# Patient Record
Sex: Male | Born: 1955 | Race: White | Hispanic: No | Smoking: Never smoker
Health system: Southern US, Community
[De-identification: ages and names within clinical notes are randomized; demographics above are authoritative.]

## PROBLEM LIST (undated history)

## (undated) DIAGNOSIS — I2089 Other forms of angina pectoris: Secondary | ICD-10-CM

## (undated) DIAGNOSIS — I208 Other forms of angina pectoris: Secondary | ICD-10-CM

## (undated) DIAGNOSIS — E785 Hyperlipidemia, unspecified: Secondary | ICD-10-CM

## (undated) DIAGNOSIS — I639 Cerebral infarction, unspecified: Secondary | ICD-10-CM

## (undated) DIAGNOSIS — I2510 Atherosclerotic heart disease of native coronary artery without angina pectoris: Secondary | ICD-10-CM

## (undated) DIAGNOSIS — E119 Type 2 diabetes mellitus without complications: Secondary | ICD-10-CM

## (undated) DIAGNOSIS — I519 Heart disease, unspecified: Secondary | ICD-10-CM

## (undated) DIAGNOSIS — I10 Essential (primary) hypertension: Secondary | ICD-10-CM

## (undated) HISTORY — DX: Atherosclerotic heart disease of native coronary artery without angina pectoris: I25.10

## (undated) HISTORY — DX: Hyperlipidemia, unspecified: E78.5

## (undated) HISTORY — DX: Heart disease, unspecified: I51.9

## (undated) HISTORY — DX: Essential (primary) hypertension: I10

## (undated) HISTORY — DX: Other forms of angina pectoris: I20.89

## (undated) HISTORY — DX: Type 2 diabetes mellitus without complications: E11.9

## (undated) HISTORY — DX: Other forms of angina pectoris: I20.8

## (undated) HISTORY — DX: Cerebral infarction, unspecified: I63.9

---

## 1993-06-01 HISTORY — PX: CORONARY ANGIOPLASTY: SHX604

## 1998-07-28 ENCOUNTER — Ambulatory Visit (HOSPITAL_BASED_OUTPATIENT_CLINIC_OR_DEPARTMENT_OTHER): Admission: RE | Admit: 1998-07-28 | Discharge: 1998-07-28 | Payer: Self-pay | Admitting: Urology

## 2007-04-04 HISTORY — PX: CORONARY ANGIOPLASTY WITH STENT PLACEMENT: SHX49

## 2007-05-01 ENCOUNTER — Encounter: Admission: RE | Admit: 2007-05-01 | Discharge: 2007-05-01 | Payer: Self-pay | Admitting: Cardiovascular Disease

## 2007-05-02 ENCOUNTER — Ambulatory Visit (HOSPITAL_COMMUNITY): Admission: RE | Admit: 2007-05-02 | Discharge: 2007-05-03 | Payer: Self-pay | Admitting: Cardiovascular Disease

## 2007-09-14 ENCOUNTER — Inpatient Hospital Stay (HOSPITAL_COMMUNITY): Admission: EM | Admit: 2007-09-14 | Discharge: 2007-09-17 | Payer: Self-pay | Admitting: Emergency Medicine

## 2007-11-05 ENCOUNTER — Inpatient Hospital Stay (HOSPITAL_COMMUNITY): Admission: EM | Admit: 2007-11-05 | Discharge: 2007-11-06 | Payer: Self-pay | Admitting: Emergency Medicine

## 2008-04-03 HISTORY — PX: CORONARY ANGIOPLASTY WITH STENT PLACEMENT: SHX49

## 2008-08-19 ENCOUNTER — Encounter: Admission: RE | Admit: 2008-08-19 | Discharge: 2008-08-19 | Payer: Self-pay | Admitting: Cardiovascular Disease

## 2008-08-21 ENCOUNTER — Inpatient Hospital Stay (HOSPITAL_COMMUNITY): Admission: RE | Admit: 2008-08-21 | Discharge: 2008-08-22 | Payer: Self-pay | Admitting: Cardiovascular Disease

## 2008-11-04 ENCOUNTER — Inpatient Hospital Stay (HOSPITAL_COMMUNITY): Admission: AD | Admit: 2008-11-04 | Discharge: 2008-11-07 | Payer: Self-pay | Admitting: Cardiovascular Disease

## 2010-03-10 ENCOUNTER — Inpatient Hospital Stay (HOSPITAL_COMMUNITY): Admission: EM | Admit: 2010-03-10 | Discharge: 2009-08-25 | Payer: Self-pay | Admitting: Emergency Medicine

## 2010-06-20 LAB — BASIC METABOLIC PANEL
BUN: 12 mg/dL (ref 6–23)
BUN: 15 mg/dL (ref 6–23)
CO2: 31 mEq/L (ref 19–32)
Calcium: 8.3 mg/dL — ABNORMAL LOW (ref 8.4–10.5)
Chloride: 102 mEq/L (ref 96–112)
Chloride: 103 mEq/L (ref 96–112)
Creatinine, Ser: 1.02 mg/dL (ref 0.4–1.5)
GFR calc Af Amer: >60 mL/min (ref >60)
GFR calc non Af Amer: >60 mL/min (ref >60)
Glucose, Bld: 113 mg/dL — ABNORMAL HIGH (ref 70–99)
Glucose, Bld: 190 mg/dL — ABNORMAL HIGH (ref 70–99)
Potassium: 4.1 mEq/L (ref 3.5–5.1)
Sodium: 137 mEq/L (ref 135–145)

## 2010-06-20 LAB — GLUCOSE, CAPILLARY
Glucose-Capillary: 117 mg/dL — ABNORMAL HIGH (ref 70–99)
Glucose-Capillary: 140 mg/dL — ABNORMAL HIGH (ref 70–99)
Glucose-Capillary: 143 mg/dL — ABNORMAL HIGH (ref 70–99)
Glucose-Capillary: 146 mg/dL — ABNORMAL HIGH (ref 70–99)
Glucose-Capillary: 207 mg/dL — ABNORMAL HIGH (ref 70–99)
Glucose-Capillary: 227 mg/dL — ABNORMAL HIGH (ref 70–99)
Glucose-Capillary: 92 mg/dL (ref 70–99)

## 2010-06-20 LAB — URINALYSIS, ROUTINE W REFLEX MICROSCOPIC
Bilirubin Urine: NEGATIVE
Nitrite: NEGATIVE
Protein, ur: NEGATIVE mg/dL
Urobilinogen, UA: 0.2 mg/dL (ref 0.0–1.0)

## 2010-06-20 LAB — COMPREHENSIVE METABOLIC PANEL
ALT: 26 U/L (ref 0–53)
AST: 23 U/L (ref 0–37)
Albumin: 3.3 g/dL — ABNORMAL LOW (ref 3.5–5.2)
Alkaline Phosphatase: 61 U/L (ref 39–117)
Alkaline Phosphatase: 69 U/L (ref 39–117)
BUN: 15 mg/dL (ref 6–23)
CO2: 27 mEq/L (ref 19–32)
Calcium: 8.3 mg/dL — ABNORMAL LOW (ref 8.4–10.5)
Chloride: 100 mEq/L (ref 96–112)
Chloride: 102 mEq/L (ref 96–112)
Creatinine, Ser: 0.93 mg/dL (ref 0.4–1.5)
GFR calc Af Amer: >60 mL/min (ref >60)
GFR calc non Af Amer: >60 mL/min (ref >60)
Glucose, Bld: 121 mg/dL — ABNORMAL HIGH (ref 70–99)
Glucose, Bld: 241 mg/dL — ABNORMAL HIGH (ref 70–99)
Potassium: 3.8 mEq/L (ref 3.5–5.1)
Potassium: 4.2 mEq/L (ref 3.5–5.1)
Sodium: 136 mEq/L (ref 135–145)
Total Bilirubin: 0.5 mg/dL (ref 0.3–1.2)
Total Bilirubin: 0.5 mg/dL (ref 0.3–1.2)
Total Protein: 6.3 g/dL (ref 6.0–8.3)

## 2010-06-20 LAB — TROPONIN I: Troponin I: 0.01 ng/mL (ref 0.00–0.06)

## 2010-06-20 LAB — CBC
HCT: 38.1 % — ABNORMAL LOW (ref 39.0–52.0)
HCT: 40.6 % (ref 39.0–52.0)
Hemoglobin: 13.1 g/dL (ref 13.0–17.0)
Hemoglobin: 13.5 g/dL (ref 13.0–17.0)
MCHC: 35.4 g/dL (ref 30.0–36.0)
MCV: 86 fL (ref 78.0–100.0)
MCV: 86.4 fL (ref 78.0–100.0)
MCV: 87.4 fL (ref 78.0–100.0)
Platelets: 251 10*3/uL (ref 150–400)
Platelets: 281 10*3/uL (ref 150–400)
Platelets: 284 10*3/uL (ref 150–400)
RBC: 4.24 MIL/uL (ref 4.22–5.81)
RBC: 4.43 MIL/uL (ref 4.22–5.81)
RDW: 12.9 % (ref 11.5–15.5)
RDW: 13.6 % (ref 11.5–15.5)
WBC: 7.3 10*3/uL (ref 4.0–10.5)
WBC: 7.7 10*3/uL (ref 4.0–10.5)
WBC: 8.2 10*3/uL (ref 4.0–10.5)

## 2010-06-20 LAB — LIPID PANEL
Cholesterol: 117 mg/dL (ref 0–200)
HDL: 30 mg/dL — ABNORMAL LOW (ref >39)
Total CHOL/HDL Ratio: 3.9 RATIO
VLDL: 47 mg/dL — ABNORMAL HIGH (ref 0–40)

## 2010-06-20 LAB — PROTIME-INR: INR: 0.96 (ref 0.00–1.49)

## 2010-06-20 LAB — DIFFERENTIAL
Basophils Absolute: 0.1 10*3/uL (ref 0.0–0.1)
Basophils Relative: 1 % (ref 0–1)
Lymphocytes Relative: 20 % (ref 12–46)
Monocytes Absolute: 0.6 10*3/uL (ref 0.1–1.0)
Neutro Abs: 6.1 10*3/uL (ref 1.7–7.7)
Neutrophils Relative %: 71 % (ref 43–77)

## 2010-06-20 LAB — HEPARIN LEVEL (UNFRACTIONATED): Heparin Unfractionated: <0.1 IU/mL — ABNORMAL LOW (ref 0.30–0.70)

## 2010-06-20 LAB — CARDIAC PANEL(CRET KIN+CKTOT+MB+TROPI)
CK, MB: 4.7 ng/mL — ABNORMAL HIGH (ref 0.3–4.0)
Total CK: 147 U/L (ref 7–232)

## 2010-06-20 LAB — URINE MICROSCOPIC-ADD ON

## 2010-06-20 LAB — MRSA PCR SCREENING: MRSA by PCR: NEGATIVE

## 2010-06-20 LAB — APTT: aPTT: 35 seconds (ref 24–37)

## 2010-06-20 LAB — TSH: TSH: 0.707 u[IU]/mL (ref 0.350–4.500)

## 2010-06-20 LAB — BRAIN NATRIURETIC PEPTIDE: Pro B Natriuretic peptide (BNP): 53 pg/mL (ref 0.0–100.0)

## 2010-07-09 LAB — CARDIAC PANEL(CRET KIN+CKTOT+MB+TROPI)
Relative Index: INVALID (ref 0.0–2.5)
Troponin I: 0.01 ng/mL (ref 0.00–0.06)

## 2010-07-09 LAB — CBC
HCT: 38.4 % — ABNORMAL LOW (ref 39.0–52.0)
HCT: 45.1 % (ref 39.0–52.0)
MCHC: 35.5 g/dL (ref 30.0–36.0)
MCV: 86.1 fL (ref 78.0–100.0)
Platelets: 250 10*3/uL (ref 150–400)
Platelets: 286 10*3/uL (ref 150–400)
RDW: 12.9 % (ref 11.5–15.5)
RDW: 13 % (ref 11.5–15.5)
WBC: 10.4 10*3/uL (ref 4.0–10.5)

## 2010-07-09 LAB — URINALYSIS, ROUTINE W REFLEX MICROSCOPIC
Hgb urine dipstick: NEGATIVE
Nitrite: POSITIVE — AB
Specific Gravity, Urine: 1.01 (ref 1.005–1.030)
Urobilinogen, UA: 1 mg/dL (ref 0.0–1.0)

## 2010-07-09 LAB — URINE CULTURE: Colony Count: >=100000

## 2010-07-09 LAB — COMPREHENSIVE METABOLIC PANEL
Albumin: 3.2 g/dL — ABNORMAL LOW (ref 3.5–5.2)
BUN: 23 mg/dL (ref 6–23)
CO2: 30 mEq/L (ref 19–32)
Calcium: 8.8 mg/dL (ref 8.4–10.5)
Chloride: 95 mEq/L — ABNORMAL LOW (ref 96–112)
Creatinine, Ser: 1.26 mg/dL (ref 0.4–1.5)
GFR calc non Af Amer: 60 mL/min — ABNORMAL LOW (ref >60)
Total Bilirubin: 0.7 mg/dL (ref 0.3–1.2)

## 2010-07-09 LAB — URINE MICROSCOPIC-ADD ON

## 2010-07-09 LAB — BASIC METABOLIC PANEL
BUN: 19 mg/dL (ref 6–23)
CO2: 29 mEq/L (ref 19–32)
Chloride: 92 mEq/L — ABNORMAL LOW (ref 96–112)
Glucose, Bld: 184 mg/dL — ABNORMAL HIGH (ref 70–99)
Potassium: 4 mEq/L (ref 3.5–5.1)

## 2010-07-09 LAB — DIFFERENTIAL
Basophils Absolute: 0 10*3/uL (ref 0.0–0.1)
Lymphocytes Relative: 17 % (ref 12–46)
Lymphs Abs: 1.7 10*3/uL (ref 0.7–4.0)
Neutro Abs: 7.9 10*3/uL — ABNORMAL HIGH (ref 1.7–7.7)

## 2010-07-09 LAB — LIPASE, BLOOD: Lipase: 19 U/L (ref 11–59)

## 2010-07-09 LAB — GLUCOSE, CAPILLARY
Glucose-Capillary: 104 mg/dL — ABNORMAL HIGH (ref 70–99)
Glucose-Capillary: 124 mg/dL — ABNORMAL HIGH (ref 70–99)
Glucose-Capillary: 146 mg/dL — ABNORMAL HIGH (ref 70–99)
Glucose-Capillary: 182 mg/dL — ABNORMAL HIGH (ref 70–99)
Glucose-Capillary: 212 mg/dL — ABNORMAL HIGH (ref 70–99)
Glucose-Capillary: 295 mg/dL — ABNORMAL HIGH (ref 70–99)
Glucose-Capillary: 95 mg/dL (ref 70–99)

## 2010-07-09 LAB — PROTIME-INR
INR: 1.1 (ref 0.00–1.49)
Prothrombin Time: 14.4 seconds (ref 11.6–15.2)

## 2010-07-09 LAB — HEPARIN LEVEL (UNFRACTIONATED): Heparin Unfractionated: 0.11 IU/mL — ABNORMAL LOW (ref 0.30–0.70)

## 2010-07-09 LAB — CK TOTAL AND CKMB (NOT AT ARMC)
CK, MB: 2 ng/mL (ref 0.3–4.0)
Relative Index: INVALID (ref 0.0–2.5)

## 2010-07-09 LAB — BRAIN NATRIURETIC PEPTIDE: Pro B Natriuretic peptide (BNP): 50 pg/mL (ref 0.0–100.0)

## 2010-07-09 LAB — D-DIMER, QUANTITATIVE: D-Dimer, Quant: 0.31 ug/mL-FEU (ref 0.00–0.48)

## 2010-07-09 LAB — AMYLASE: Amylase: 38 U/L (ref 27–131)

## 2010-07-12 LAB — BASIC METABOLIC PANEL
CO2: 25 mEq/L (ref 19–32)
Calcium: 8.6 mg/dL (ref 8.4–10.5)
Chloride: 102 mEq/L (ref 96–112)
GFR calc non Af Amer: >60 mL/min (ref >60)
Potassium: 4.3 mEq/L (ref 3.5–5.1)

## 2010-07-12 LAB — CARDIAC PANEL(CRET KIN+CKTOT+MB+TROPI)
CK, MB: 4.1 ng/mL — ABNORMAL HIGH (ref 0.3–4.0)
Total CK: 60 U/L (ref 7–232)
Total CK: 65 U/L (ref 7–232)
Troponin I: 0.04 ng/mL (ref 0.00–0.06)

## 2010-07-12 LAB — CBC
HCT: 43.7 % (ref 39.0–52.0)
Hemoglobin: 15.1 g/dL (ref 13.0–17.0)
MCHC: 34.6 g/dL (ref 30.0–36.0)
RDW: 13.5 % (ref 11.5–15.5)

## 2010-07-12 LAB — GLUCOSE, CAPILLARY: Glucose-Capillary: 217 mg/dL — ABNORMAL HIGH (ref 70–99)

## 2010-08-16 NOTE — Cardiovascular Report (Signed)
[name], Adkins NO.:  [id number]   MEDICAL RECORD NO.:  [id number]          PATIENT TYPE:  INP   LOCATION:  2926                         FACILITY:  [facility]   PHYSICIAN:  Nanetta Batty, M.D.   DATE OF BIRTH:  [birth year]   DATE OF PROCEDURE:  DATE OF DISCHARGE:                            CARDIAC CATHETERIZATION   Douglas Adkins is a 55 year old white male who is the twin brother of Douglas Adkins, who  is also a patient of mine.  He has a history of CAD, status post Cypher  stenting of his RCA on May 02, 2007.  He has a chronically occluded  and recanalized mid LAD as well as PDA and PLA disease too small to  intervene upon.  His EF is in the 45% range with an apical wall motion  abnormality.  His other problems include discontinued tobacco abuse a  year ago, hyperlipidemia, and non-insulin-requiring diabetes.  Over the  several weeks prior to recently seeing him, he has had accelerating  angina now on a daily basis.  He presents now for elective outpatient  diagnostic coronary arteriography to define his anatomy and rule out  ischemic etiology.   DESCRIPTION OF PROCEDURE:  The patient brought to the Second Floor [facility] Cardiac Cath Lab in a postabsorptive state.  He was premedicated  with p.o. Valium.  His right groin was prepped and draped in the usual  sterile fashion.  Xylocaine 1% was used for local anesthesia.  A 6-  French sheath was inserted into the right femoral artery using standard  Seldinger technique.  A 6-French right and left Judkins diagnostic  catheter as well as 6-French pigtail catheter were used for selective  coronary angiography and left ventriculography respectively.  Visipaque  dye was used for the entirety of the case.  Retrograde aortic, left  ventricular, and pullback pressures were recorded.   HEMODYNAMICS:  1. Aortic systolic pressure 157, diastolic pressure 82.  2. Ventricle systolic pressure 155, end-diastolic pressure of 13.   SELECTIVE CORONARY ANGIOGRAPHY:  1. Left main normal.  2. LAD; LAD had a long 99% stenosis in the midportion after the second      diagonal branch.  This was a recanalized lesion and has been      documented prior to cath.  The first diagonal branch had a 60-70%      lesion, but this was a less than 2 mm vessel.  3. Left circumflex; circumflex had a 80% proximal AV groove stenosis      followed by a long 80% stenosis in the distal vessel.  4. Right coronary; dominant vessel with a patent stent in the      midportion.  There was a 99% mid PDA lesion which is chronic and      small 90% to 95% PLA lesion which is also chronic and small.   Left ventriculography; RAO left ventriculogram was performed using 25 mL  of Visipaque dye at 12 mL per second.  The overall LVEF was estimated at  approximately 25% with anteroapical dyskinesia.   IMPRESSION:  Douglas Adkins has new lesions in his proximal and  distal circumflex.  We will proceed with percutaneous coronary intervention and stenting  using drug-eluting stent and Angiomax.   DESCRIPTION OF PROCEDURE:  The patient did take aspirin this morning and  has been chronically on 75 mg of p.o. Plavix.  I gave him an additional  300 mg Plavix, and 20 mg of Pepcid IV.  He received the Angiomax bolus  with an ACT of 340.   Using a 6-French XB 3.5 guide catheter along with an L1 4190 Asahi soft  wire and a 2015 Apex, PTCA was performed on the distal circumflex.  Intracoronary nitroglycerin 200 mcg was then administered.  The area was  stented with a 225 x 24 Taxus Atom drug-eluting stent at 12 atmospheres  and postdilated with a 2.5 x 20 [place] Voyager at 14-16 atmospheres with  resulting reduction of long 80% stenosis to 0% residual.  Following  this, direct stenting was performed of the proximal circumflex with a  3.5 x 15 Xience 5 drug-eluting stent at 10 atmospheres (3.62 mm) and  postdilated with a 3.75 x 12 [place] Voyager at 16 atmospheres (3.75 mm) with   resulting reduction of 80% stenosis to 0% residual.  The patient  tolerated the procedure well without hemodynamic or electrocardiographic  sequelae.   IMPRESSION:  Successful percutaneous coronary intervention and stenting  of proximal and distal circumflex with Abbott, Xience, and Taxus Atom  drug-eluting stents.  The patient tolerated the procedure well.  The  guidewire and catheter were removed.  The sheath was then secured in  place.  The patient left the lab in stable condition.  He will be  hydrated overnight, and discharged home in the morning if he remains  clinically stable.      Nanetta Batty, M.D.  Electronically Signed     JB/MEDQ  D:  08/21/2008  T:  08/22/2008  Job:  [id number]   cc:   Second Floor [facility]  [facility]  [facility]

## 2010-08-16 NOTE — Discharge Summary (Signed)
[name], Douglas Adkins NO.:  [id number]   MEDICAL RECORD NO.:  [id number]          PATIENT TYPE:  INP   LOCATION:  2926                         FACILITY:  [facility]   PHYSICIAN:  Nanetta Batty, M.D.   DATE OF BIRTH:  02-Nov-1955   DATE OF ADMISSION:  08/21/2008  DATE OF DISCHARGE:  08/22/2008                               DISCHARGE SUMMARY   DISCHARGE DIAGNOSES:  1. Accelerating angina occurring daily.  2. Coronary artery disease with previous stent to his right coronary      artery and a chronically occluded mid left anterior descending and      posterior descending artery and posterolateral artery disease.      a.     New stenosis in the circumflex, 80% proximal and mid       undergoing percutaneous transluminal coronary angioplasty and       stent deployment with 2 drug-eluting stents, Taxus, Liberte, and a       Xience.  3. Mild left ventricular dysfunction with ejection fraction of 45%.  4. Hypertension.  5. Hyperlipidemia.  6. Non-insulin-dependent diabetes mellitus.   DISCHARGE CONDITION:  Improved.   PROCEDURES:  1. On Aug 21, 2008, combined left heart cath by Dr. Nanetta Batty.  2. Aug 21, 2008, percutaneous transluminal coronary angioplasty and      stent deployment x2 in the circumflex with drug-eluting stent.   DISCHARGE MEDICATIONS:  1. Aspirin 325 mg enteric coated daily.  2. Over-the-counter potassium 99 mg daily.  3. Metoprolol succinate 50 mg, we will decrease to one twice a day as      the patient had had it increased prior to the procedure.  4. Amlodipine 5 mg daily.  5. Glimepiride 4 mg daily.  6. Fexofenadine 180 mg daily.  7. Metformin 500 mg 2 twice a day, do not take until Aug 24, 2008, as      it could react with the Keftab.  8. Plavix 75 mg one daily, do not stop Plavix.  9. Lotensin 5 mg daily.  10.Isosorbide dinitrate 20 mg 3 times a day.  11.Pravastatin 20 mg every evening.  12.Ranexa 1000 mg twice a day.  13.Gabapentin 300  mg every evening.  14.Continue the nitroglycerin 1/150 under tongue as needed for chest      pain 1 every 5 minutes up to 3 every 15 minutes, if no relief, call      911.   HOSPITAL STAY:  The patient was admitted for cardiac evaluation with  cardiac catheterization due to crescendo angina occurring on a daily  basis.  The patient underwent cardiac cath with findings of new two 80%  stenosis of his circumflex vessel, underwent stenting with drug-eluting  stents.  The patient tolerated the procedure.  He was admitted to step-  down unit overnight and did well by the next morning.  He was stable  without complications and ready for discharge home.  He will follow up  as an outpatient with Dr. Allyson Sabal.  Please note, his work conditions are  difficulty with cold physical extremes, so he will be out of work until  he is seen by Dr. Allyson Sabal.   OTHER DISCHARGE INSTRUCTIONS:  1. No work until cleared by Dr. Allyson Sabal.  2. Low-sodium, heart-healthy diabetic diet.  3. Wash cath site with soap and water.  Call if any bleeding,      swelling, or drainage.  4. Increase activity slowly.  5. May shower.  No lifting for 2 days, no driving for 2 days.  6. Follow up with Dr. Allyson Sabal in 2 weeks, office will call with date and      time.  7. If he feels dizzy or lightheaded, he will call office.      [name]. [name], N.P.      Nanetta Batty, M.D.  Electronically Signed    LRI/MEDQ  D:  08/22/2008  T:  08/23/2008  Job:  [id number]

## 2010-08-16 NOTE — Cardiovascular Report (Signed)
NAMEJAWANN, Douglas Adkins NO.:  [id number]   MEDICAL RECORD NO.:  [id number]          PATIENT TYPE:  OIB   LOCATION:  2853                         FACILITY:  [facility]   PHYSICIAN:  Nanetta Batty, M.D.   DATE OF BIRTH:  05/16/1955   DATE OF PROCEDURE:  DATE OF DISCHARGE:                            CARDIAC CATHETERIZATION   HISTORY:  Douglas Adkins is a 55 year old white male with a history of CAD,  status post cath November 11, 2003 revealing an EF of 40-45% with moderate-  severe anteroapical hypokinesia, recanalized segmental mid LAD, PDA and  PLA disease.  His risk factors include occasional tobacco abuse,  hypertension, hyperlipidemia and non-insulin-requiring diabetes.  He has  had several weeks of accelerated angina.  He presents now for diagnostic  coronary angiography to define his anatomy and rule out ischemic  etiology.   DESCRIPTION OF PROCEDURE:  The patient was brought to the second floor  [place] cardiac cath lab in the postabsorptive state.  He was  premedicated with p.o. Valium.  His right groin was prepped and shaved  in the usual sterile fashion.  Xylocaine 1% was used for local  anesthesia.  A 6-French sheath was inserted into the right femoral  artery using standard Seldinger technique.  A 6-French right-left  Judkins diagnostic catheters along with a  6-French pigtail catheter were used for selective coronary angiography,  left ventriculography and distal abdominal aortography.  Visipaque dye  was used for the entirety of the case.  Retrograde aortic and left  ventricular pressures were recorded.   HEMODYNAMIC RESULTS:  Aortic systolic pressure 153, diastolic pressure  85.  Left ventricular systolic pressure 157, end-diastolic pressure 32.   Selective cholangiography:  Left main normal.  LAD had a long 90-95%  segmental recanalized the lesion in the mid segment.  The LAD gave off a  moderate-sized first diagonal branch from the proximal third of  the  vessel.   Left circumflex:  A 40% segmental mid wall without other significant  disease.   Right coronary artery:  Dominant vessel to 90-95% fairly focal mid  stenosis followed by 90% stenoses in the PDA and PLA vessels.   Left ventriculography:  RAO left ventriculogram was performed using 25  cc of Visipaque dye at 12 cc per second.  The overall LVEF was estimated  at 40-45% with  moderate-severe anterior and apical as well as inferoapical hypokinesia.   Distal abdominal aortography:  Distal abdominal aortogram was performed  using 20 cc of Visipaque dye at 20 cc per second.  The renal arteries  were widely patent.  Infrarenal abdominal and iliac bifurcation were  free of stenotic or atherosclerotic changes.   IMPRESSION:  Douglas Adkins has a new high-grade stenosis in the mid  portion of the vessel on the right.  We will proceed with PCI stenting  using Angiomax and drug-eluting stent.   DESCRIPTION OF PROCEDURE:  The patient received 3 chewable aspirin, 600  mg of p.o. Plavix, Pepcid IV and Angiomax bolus with ACT of 314.   Using a 6-French JR-4 guide catheter along with __________ Leota Jacobsen soft  wire,  2512 Maverick PCI was performed at nominal pressures.  Following  this, a 3023 Cypher drug-eluting stent was then deployed at 14  atmospheres and postdilated with a 32520 Quantum Maverick at 14  atmospheres (3.3 mm) resulting reduction of 90% stenosis to 0% residual.  The patient tolerated the procedure well.  There were no hemodynamic  electrocardiograph sequelae.  The guidewire and catheter were removed.  Sheath was then secured in place.  The patient left the lab in stable  condition.  Sheath will be removed in 2 hours.  The patient will remain  recumbent for 6 hours thereafter.  He will be discharged home in the  morning if he remains clinically stable.  He left the lab in stable  condition.      Nanetta Batty, M.D.  Electronically Signed     JB/MEDQ  D:   05/02/2007  T:  05/02/2007  Job:  [id number]   cc:   Cardiac cath lab  [facility]  [facility]

## 2010-08-16 NOTE — Discharge Summary (Signed)
NAMEESTEVEN, OVERFELT             ACCOUNT NO.:  [id number]   MEDICAL RECORD NO.:  [id number]          PATIENT TYPE:  OIB   LOCATION:  6531                         FACILITY:  [facility]   PHYSICIAN:  Douglas Adkins, M.D.   DATE OF BIRTH:  12-Jan-1956   DATE OF ADMISSION:  05/02/2007  DATE OF DISCHARGE:  05/03/2007                               DISCHARGE SUMMARY   DISCHARGE DIAGNOSES:  1. Coronary artery disease.  2. Increasing angina.  3. Coronary artery disease with 95% stenosis of the right coronary      artery undergoing percutaneous transluminal coronary angioplasty      and drug-eluting Cypher stent.  4. A 95% left anterior descending artery disease, recannulated, old.  5. Decreased left ventricular function at 40-45%.   OTHER DISCHARGE DIAGNOSES:  1. Hypertension.  2. Occasional tobacco use.  3. Hyperlipidemia.  4. Non-insulin-dependent diabetes mellitus.   DISCHARGE CONDITION:  Improved.   PROCEDURES:  1. Combined left heart catheterization May 02, 2007.  2. May 02, 2007, PTCA and stent deployment to a 95% RCA stenosis      by Dr. Allyson Sabal.   HISTORY OF PRESENT ILLNESS:  A 55 year old mildly overweight white male  whose twin brother is also a cardiology patient of Dr. Hazle Coca.  This  patient, Mr. Douglas Adkins, has a history of coronary disease, last  catheterized August 2005.  He had a recannulized LAD, 90% PDA and PLA  with an EF of 40-45%, moderate to severe anteroapical hypokinesis.  The  history includes hypertension, hyperlipidemia, non-insulin-dependent  diabetes mellitus.  He had asked him to have a stress test in 2007 which  was never performed.  He had not come back until recently.  He was  complaining of chest pain, unstable angina, crescendo pattern.  Dr.  Allyson Sabal saw him in the office on January 28 and scheduled him for cardiac  catheterization on January 29.   DISCHARGE MEDICATIONS:  1. Aspirin 325 mg daily for 1 month, then go back to two 81-mg  tablets.  2. Potassium OTC 99 mg daily.  3. Metoprolol succinate 50 mg, one-and-a-half tablets daily.  4. Amlodipine 5 mg daily.  5. Imdur 60 mg daily.  6. Glimepiride 4 mg daily.  7. Fexofenadine 180 mg daily.  8. Metformin 500 mg two tablets twice a day.  He will hold that until      Sunday, May 05, 2007, then resume as before.  9. Folic acid one tablet daily.  10.Fish oil one tablet daily.  11.Plavix 75 mg one daily.  Do not stop - it could cause a heart      attack.   Please note, the patient will be started on an ACE inhibitor as an  outpatient if continues to maintain blood pressure.   DISCHARGE INSTRUCTIONS:  1. No work until May 19, 2007.  2. Low-sodium, heart-healthy diabetic diet.  3. Increase activity slowly.  4. May shower/bathe.  5. No lifting for 2 days, no driving for 2 days.  6. Wash catheterization site with soap and water.  Call if any      bleeding, swelling or drainage.  7. See Dr. Allyson Sabal May 17, 2007, at 11:30 a.m.   The patient underwent a cardiac catheterization as stated previously,  tolerated the procedure well.  He did well overnight and by the morning  of May 03, 2007, he was stable and ready for discharge home.  He was  seen and examined by Dr. Allyson Sabal.  He was consulted for tobacco cessation.  He does know he needs to stop smoking completely.  He will continue  other medications and follow up with Dr. Allyson Sabal.   Please note, as an outpatient he will need fasting lipids and to begin a  statin therapy.      Douglas Adkins. Douglas Adkins, N.P.      Douglas Adkins, M.D.  Electronically Signed    LRI/MEDQ  D:  05/03/2007  T:  05/04/2007  Job:  [id number]   cc:   Avelino Leeds

## 2010-08-19 NOTE — Discharge Summary (Signed)
NAMEJOHNELL, BAS NO.:  [id number]   MEDICAL RECORD NO.:  [id number]          PATIENT TYPE:  INP   LOCATION:  6527                         FACILITY:  [facility]   PHYSICIAN:  Nanetta Batty, M.D.   DATE OF BIRTH:  Oct 30, 1955   DATE OF ADMISSION:  11/05/2007  DATE OF DISCHARGE:  11/06/2007                               DISCHARGE SUMMARY   DISCHARGE DIAGNOSES:  1. Chronic stable angina.  2. Negative myocardial infarction.  3. Known coronary artery disease with stent to the right coronary      artery in January 2009.  4. Non-insulin-dependent diabetes mellitus.  5. Tobacco use.  6. Hypertension.  7. Dyslipidemia.   DISCHARGE CONDITION:  Stable.   PROCEDURES:  None.   DISCHARGE MEDICATIONS:  1. Aspirin 81 mg daily.  2. Fish oil weekly.  3. Metformin 500 mg 2 tablets twice a day.  4. Vitamin D one tablet daily.  5. Plavix 75 mg daily.  6. Metoprolol 50 mg twice a day.  7. Amaryl 4 mg daily.  8. Amlodipine 5 mg daily.  9. Pravastatin 20 mg daily.  10.Benazepril 5 mg daily.  11.Allegra 180 mg daily.  12.Isosorbide dinitrate 20 mg daily.  13.Ranexa 500 mg one twice a day.  14.Nitroglycerin sublingual as needed under the tongue for chest pain.   DISCHARGE INSTRUCTIONS:  1. Return to work on November 18, 2007.  2. Low-sodium heart-healthy diabetic diet.  3. Activity without restrictions.  4. Follow with Dr. Allyson Sabal on November 21, 2007, at 12 noon.   HISTORY OF PRESENT ILLNESS:  A 55 year old white male, patient of Dr.  Allyson Sabal, with coronary disease including stent to the RCA, Cypher drug-  eluting stent with chronic recanalization of the LAD.  There was chronic  PDA and PL disease treated medically.  EF 45%.  Other history includes  hypertension, dyslipidemia, diabetes mellitus type 2, and tobacco use.  Presented to [facility] Emergency Room on November 05, 2007, with  complaints of chest pain started at 11 p.m. and he presented around 5  a.m., complained of  substernal chest pain and radiation started while  working at [employer].  It was associated with shortness of breath,  nausea, but no diaphoresis.  Blood pressure was taken at that time was  157/70.  He took his isosorbide and aspirin and some easing of the pain,  but the pain came back at 2 a.m., which was more severe.  At that point  called the EMS.  No EKG changes.  Markers were negative and he was  admitted to rule out MI.   PAST MEDICAL HISTORY:  1. Coronary disease as stated.  2. Normal LV function.  3. Hypertension.  4. Dyslipidemia.   Family history, social history, review of systems, see H and P.   PHYSICAL EXAMINATION AT DISCHARGE:  Blood pressure was reviewed by Dr.  Jacinto Halim.  Exam by Dr. Jacinto Halim was stable and no further chest pain.  Ready  for discharge home.   LABORATORY DATA:  Hemoglobin 13.7, hematocrit 40, WBC 8.2, platelets  307, neutrophils 66, lymphs 25, monos 6, eosinophils 3, basophils 0,  Pro  time 13.3, and INR of 1, PTT 33.  On heparin, he was therapeutic.   Chemistry:  Sodium 134, potassium 3.8, chloride 99, CO2 of 26, glucose  126, BUN 13, creatinine 0.88.   Total protein 5.8, albumin 3.2, AST 21, ALT 18, alkaline phos 59, total  bili 0.5, magnesium 1.8.  Glycohemoglobin was 7.  Cardiac enzymes:  CK  138, MB 5.1, and troponin 0.01, all were negative.  He had two more sets  of CK-MB.  BNP was 40.   Total cholesterol 100, triglycerides 271, HDL 31, LDL 15.   TSH 1.370.  EKG:  Sinus rhythm, right axis,deviation and anterior septal  infarct on 980 inches.   No acute changes in EKGs.  The patient was admitted, ruled out for an MI  and by November 06, 2007, no further chest pain.  It was felt to be chronic  angina and he had a Ranexa added to his medical regimen.  He will follow  up with Dr. Allyson Sabal.      [name]. [name], N.P.      Nanetta Batty, M.D.  Electronically Signed    LRI/MEDQ  D:  12/03/2007  T:  12/04/2007  Job:  [id number]

## 2010-12-22 LAB — CBC
HCT: 43
Hemoglobin: 15.1
MCHC: 35.2
MCV: 84
RBC: 5.11
WBC: 8.4

## 2010-12-22 LAB — BASIC METABOLIC PANEL
CO2: 27
Chloride: 102
GFR calc Af Amer: >60
Potassium: 4.1
Sodium: 134 — ABNORMAL LOW

## 2010-12-22 LAB — TROPONIN I: Troponin I: 0.02

## 2010-12-22 LAB — CK TOTAL AND CKMB (NOT AT ARMC): Relative Index: INVALID

## 2010-12-29 LAB — BASIC METABOLIC PANEL
BUN: 10
BUN: 9
BUN: 8
BUN: 9
CO2: 26
CO2: 26
Calcium: 8.7
Calcium: 9
Chloride: 102
Chloride: 102
Chloride: 103
Creatinine, Ser: 0.95
GFR calc Af Amer: >60
GFR calc non Af Amer: >60
Glucose, Bld: 141 — ABNORMAL HIGH
Glucose, Bld: 165 — ABNORMAL HIGH
Glucose, Bld: 141 — ABNORMAL HIGH
Glucose, Bld: 164 — ABNORMAL HIGH
Potassium: 3.8
Potassium: 3.9
Potassium: 4.2

## 2010-12-29 LAB — CBC
HCT: 40.4
HCT: 40.5
HCT: 40.1
HCT: 44.1
Hemoglobin: 15.6
MCHC: 35.8
MCHC: 35.1
MCHC: 35.4
MCV: 85.7
MCV: 85.8
MCV: 85.1
MCV: 85.8
Platelets: 258
Platelets: 266
Platelets: 261
RBC: 4.56
RDW: 13.5
RDW: 13.1
RDW: 13.5
WBC: 11.7 — ABNORMAL HIGH
WBC: 8.7

## 2010-12-29 LAB — DIFFERENTIAL
Basophils Absolute: 0
Basophils Relative: 0
Eosinophils Absolute: 0.3
Monocytes Relative: 6
Neutrophils Relative %: 68

## 2010-12-29 LAB — COMPREHENSIVE METABOLIC PANEL
ALT: 23
Alkaline Phosphatase: 70
CO2: 27
GFR calc non Af Amer: >60
Glucose, Bld: 132 — ABNORMAL HIGH
Potassium: 3.4 — ABNORMAL LOW
Sodium: 136

## 2010-12-29 LAB — LIPID PANEL
Cholesterol: 103
HDL: 28 — ABNORMAL LOW
Total CHOL/HDL Ratio: 3.7

## 2010-12-29 LAB — POCT CARDIAC MARKERS
CKMB, poc: 3.3
CKMB, poc: 3.5
Myoglobin, poc: 81.8
Troponin i, poc: <0.05
Troponin i, poc: <0.05

## 2010-12-29 LAB — PROTIME-INR: INR: 0.9

## 2010-12-29 LAB — CARDIAC PANEL(CRET KIN+CKTOT+MB+TROPI)
CK, MB: 2.7
Total CK: 127
Total CK: 78
Troponin I: <0.01
Troponin I: <0.01

## 2010-12-29 LAB — CK TOTAL AND CKMB (NOT AT ARMC): Total CK: 240 — ABNORMAL HIGH

## 2010-12-29 LAB — H. PYLORI ANTIBODY, IGG: H Pylori IgG: <0.4

## 2010-12-29 LAB — HEPARIN LEVEL (UNFRACTIONATED): Heparin Unfractionated: 0.35

## 2010-12-30 LAB — TSH: TSH: 1.37

## 2010-12-30 LAB — CBC
HCT: 40.7
HCT: 41.5
Hemoglobin: 14.3
MCHC: 34.3
MCHC: 35
MCV: 87
Platelets: 307
Platelets: 307
RBC: 4.57
RBC: 4.77
RBC: 4.78
RDW: 13.4
RDW: 13.4
RDW: 13.7

## 2010-12-30 LAB — DIFFERENTIAL
Basophils Absolute: 0
Basophils Relative: 0
Eosinophils Relative: 3
Lymphocytes Relative: 25
Lymphs Abs: 2
Monocytes Relative: 7
Neutro Abs: 5.3
Neutrophils Relative %: 66

## 2010-12-30 LAB — MAGNESIUM: Magnesium: 1.8

## 2010-12-30 LAB — CK TOTAL AND CKMB (NOT AT ARMC)
CK, MB: 5.1 — ABNORMAL HIGH
Relative Index: INVALID
Relative Index: INVALID
Total CK: 138
Total CK: 80

## 2010-12-30 LAB — POCT I-STAT, CHEM 8
BUN: 14
HCT: 39
Hemoglobin: 13.3
Sodium: 134 — ABNORMAL LOW
TCO2: 26

## 2010-12-30 LAB — POCT CARDIAC MARKERS
CKMB, poc: 3.6
Myoglobin, poc: 85.8

## 2010-12-30 LAB — TROPONIN I: Troponin I: 0.01

## 2010-12-30 LAB — PROTIME-INR
INR: 1
Prothrombin Time: 13.3

## 2010-12-30 LAB — HEMOGLOBIN A1C
Hgb A1c MFr Bld: 7 — ABNORMAL HIGH
Mean Plasma Glucose: 172

## 2010-12-30 LAB — COMPREHENSIVE METABOLIC PANEL
BUN: 13
Calcium: 8.6
Creatinine, Ser: 0.88
Glucose, Bld: 126 — ABNORMAL HIGH
Total Protein: 5.8 — ABNORMAL LOW

## 2010-12-30 LAB — HEPARIN LEVEL (UNFRACTIONATED): Heparin Unfractionated: 0.36

## 2010-12-30 LAB — B-NATRIURETIC PEPTIDE (CONVERTED LAB): Pro B Natriuretic peptide (BNP): 40

## 2010-12-30 LAB — LIPID PANEL
Cholesterol: 100
Total CHOL/HDL Ratio: 3.2

## 2012-03-25 ENCOUNTER — Other Ambulatory Visit: Payer: Self-pay | Admitting: *Deleted

## 2012-04-05 ENCOUNTER — Encounter (HOSPITAL_COMMUNITY): Payer: Self-pay | Admitting: Pharmacy Technician

## 2012-05-14 ENCOUNTER — Other Ambulatory Visit: Payer: Self-pay | Admitting: Cardiovascular Disease

## 2012-05-14 ENCOUNTER — Ambulatory Visit
Admission: RE | Admit: 2012-05-14 | Discharge: 2012-05-14 | Disposition: A | Discharge status: Home / Self Care | Payer: BC Managed Care – PPO | Source: Ambulatory Visit | Attending: Cardiovascular Disease | Admitting: Cardiovascular Disease

## 2012-05-14 DIAGNOSIS — R0602 Shortness of breath: Secondary | ICD-10-CM

## 2012-05-14 DIAGNOSIS — R079 Chest pain, unspecified: Secondary | ICD-10-CM

## 2012-05-20 ENCOUNTER — Encounter (HOSPITAL_COMMUNITY): Payer: Self-pay | Admitting: Anesthesiology

## 2012-05-20 ENCOUNTER — Ambulatory Visit (HOSPITAL_COMMUNITY)
Admission: RE | Admit: 2012-05-20 | Discharge: 2012-05-20 | Disposition: A | Discharge status: Home / Self Care | Payer: BC Managed Care – PPO | Source: Ambulatory Visit | Attending: Cardiovascular Disease | Admitting: Cardiovascular Disease

## 2012-05-20 ENCOUNTER — Encounter (HOSPITAL_COMMUNITY)
Admission: RE | Disposition: A | Discharge status: Home / Self Care | Payer: Self-pay | Source: Ambulatory Visit | Attending: Cardiovascular Disease

## 2012-05-20 DIAGNOSIS — Z9861 Coronary angioplasty status: Secondary | ICD-10-CM | POA: Insufficient documentation

## 2012-05-20 DIAGNOSIS — I2510 Atherosclerotic heart disease of native coronary artery without angina pectoris: Secondary | ICD-10-CM | POA: Insufficient documentation

## 2012-05-20 DIAGNOSIS — E119 Type 2 diabetes mellitus without complications: Secondary | ICD-10-CM | POA: Insufficient documentation

## 2012-05-20 DIAGNOSIS — I10 Essential (primary) hypertension: Secondary | ICD-10-CM | POA: Insufficient documentation

## 2012-05-20 DIAGNOSIS — I209 Angina pectoris, unspecified: Secondary | ICD-10-CM | POA: Insufficient documentation

## 2012-05-20 DIAGNOSIS — E785 Hyperlipidemia, unspecified: Secondary | ICD-10-CM | POA: Insufficient documentation

## 2012-05-20 HISTORY — PX: CARDIAC CATHETERIZATION: SHX172

## 2012-05-20 HISTORY — PX: LEFT HEART CATHETERIZATION WITH CORONARY ANGIOGRAM: SHX5451

## 2012-05-20 LAB — GLUCOSE, CAPILLARY: Glucose-Capillary: 227 mg/dL — ABNORMAL HIGH (ref 70–99)

## 2012-05-20 SURGERY — LEFT HEART CATHETERIZATION WITH CORONARY ANGIOGRAM
Anesthesia: Choice

## 2012-05-20 MED ORDER — ONDANSETRON HCL 4 MG/2ML IJ SOLN: 4.0000 mg | Freq: Four times a day (QID) | INTRAMUSCULAR | Status: DC | PRN

## 2012-05-20 MED ORDER — ASPIRIN 81 MG PO CHEW
CHEWABLE_TABLET | ORAL | Status: AC
  Filled 2012-05-20: qty 4

## 2012-05-20 MED ORDER — LIDOCAINE HCL (PF) 1 % IJ SOLN
INTRAMUSCULAR | Status: AC
  Filled 2012-05-20: qty 30

## 2012-05-20 MED ORDER — SODIUM CHLORIDE 0.9 % IJ SOLN: 3.0000 mL | INTRAMUSCULAR | Status: DC | PRN

## 2012-05-20 MED ORDER — SODIUM CHLORIDE 0.9 % IV SOLN: INTRAVENOUS | Status: DC

## 2012-05-20 MED ORDER — CLOPIDOGREL BISULFATE 75 MG PO TABS
ORAL_TABLET | ORAL | Status: AC
  Filled 2012-05-20: qty 1

## 2012-05-20 MED ORDER — HEPARIN (PORCINE) IN NACL 2-0.9 UNIT/ML-% IJ SOLN
INTRAMUSCULAR | Status: AC
  Filled 2012-05-20: qty 1000

## 2012-05-20 MED ORDER — SODIUM CHLORIDE 0.9 % IV SOLN
INTRAVENOUS | Status: DC
  Administered 2012-05-20: 75 mL/h via INTRAVENOUS

## 2012-05-20 MED ORDER — DIAZEPAM 5 MG PO TABS
5.0000 mg | ORAL_TABLET | ORAL | Status: AC
  Administered 2012-05-20: 5 mg via ORAL
  Filled 2012-05-20: qty 1

## 2012-05-20 MED ORDER — ACETAMINOPHEN 325 MG PO TABS: 650.0000 mg | ORAL_TABLET | ORAL | Status: DC | PRN

## 2012-05-20 NOTE — H&P (Signed)
  H & P will be scanned in.  Pt was reexamined and existing H & P reviewed. No changes found.  Runell Gess, MD [facility] 05/20/2012 9:13 AM

## 2012-05-20 NOTE — Progress Notes (Signed)
JENNIFER,RN FROM CATH LAB NOTIFIED OF NO PLAVIX OR ASA ORDERED FOR TODAY

## 2012-05-20 NOTE — CV Procedure (Signed)
Douglas Adkins is a 58 y.o. male    [id number] LOCATION:  FACILITY: [facility]  PHYSICIAN: Nanetta Batty, M.D. 24-Jun-1955   DATE OF PROCEDURE:  05/20/2012  DATE OF DISCHARGE:  [facility]  CARDIAC CATHETERIZATION     History obtained from chart review. Douglas Adkins is a 57 year old single Caucasian male with history of CAD status post remote LAD intervention back in 1995. In January 2009 he had stenting of his RCA. In May 2000 and I stented his circumflex in 2 places with drug-eluting stents. At that time his RCA stent was patent and he had a long recanalized mid LAD segment. His EF by echo was in the 45% range. He's had progressive nitroglycerin responsive angina. Other problems include hypertension, dyslipidemia and type 2 diabetes. 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 He presents today for outpatient diagnostic cardiac catheterization to define his anatomy.   PROCEDURE DESCRIPTION:    The patient was brought to the second floor [place] Cardiac cath lab in the postabsorptive state. He was  premedicated with Valium 5 mg by mouth. His right groinwas prepped and shaved in usual sterile fashion. Xylocaine 1% was used  for local anesthesia. A 5 French sheath was inserted into the right common femoral artery using standard Seldinger technique. 5 French right and left Judkins diagnostic catheters along with a 5 French pigtail catheter were used for selective coronary angiography, left ventriculography, and subselective left internal mammary artery angiography. Visipaque dye was used for the entirety of the case. Retrograde aortic, left ventricular and pullback pressures were recorded.   HEMODYNAMICS:    AO SYSTOLIC/AO DIASTOLIC: 151/77   LV SYSTOLIC/LV DIASTOLIC: 146/19  ANGIOGRAPHIC RESULTS:   1. Left main; normal  2. LAD; long recanalized mid LAD occlusion with a widely patent distal apical LAD. The first diagonal branch was moderate in  size with a 50-60% segmental proximal stenosis. 3. Left circumflex; nondominant with a patent proximal stent and occluded mid stent with faint left to left collaterals to the distal circumflex. There was a moderate size ramus branch that was free of significant disease..  4. Right coronary artery; dominant with a patent stent in the midportion. There is a long segmental 50-60% at the "genu". It was a 90% mid PDA an ostial PLA stenosis unchanged from prior cath 5.LIMA was subselectively visualized a widely patent. Was suitable for use during coronary artery bypass grafting if necessary 6. Left ventriculography; RAO left ventriculogram was performed using  25 mL of Visipaque dye at 12 mL/second. The overall LVEF estimated  45 %  With wall motion abnormalities notable for moderate anteroapical hypokinesia.  IMPRESSION:Douglas Adkins has three-vessel disease with mild LV dysfunction. The diabetic. I believe the best therapy would be complete revascularization using coronary artery bypass grafting. Management was performed of the right common femoral artery via the SideArm sheath and the puncture was sealed with a "MYNX" closure device with excellent hemostasis. The patient left the Cath Lab in stable condition. He Will remain recumbent for 2 hours and gently hydrated. He'll then be sent home I will see him back in the office in followup in 2-3 weeks.  Runell Gess MD, [facility] 05/20/2012 9:55 AM

## 2012-10-01 ENCOUNTER — Ambulatory Visit: Payer: BC Managed Care – PPO | Admitting: Cardiology

## 2012-10-28 ENCOUNTER — Encounter: Payer: Self-pay | Admitting: Cardiology

## 2012-10-28 DIAGNOSIS — I2510 Atherosclerotic heart disease of native coronary artery without angina pectoris: Secondary | ICD-10-CM | POA: Insufficient documentation

## 2012-10-28 DIAGNOSIS — I519 Heart disease, unspecified: Secondary | ICD-10-CM | POA: Insufficient documentation

## 2012-10-28 DIAGNOSIS — E119 Type 2 diabetes mellitus without complications: Secondary | ICD-10-CM | POA: Insufficient documentation

## 2012-10-28 DIAGNOSIS — I10 Essential (primary) hypertension: Secondary | ICD-10-CM

## 2012-10-28 DIAGNOSIS — E785 Hyperlipidemia, unspecified: Secondary | ICD-10-CM

## 2012-11-01 ENCOUNTER — Ambulatory Visit: Payer: BC Managed Care – PPO | Admitting: Cardiology

## 2013-01-10 ENCOUNTER — Encounter: Payer: Self-pay | Admitting: Cardiovascular Disease

## 2013-02-27 IMAGING — CR DG CHEST 2V
3 series · 3 of 3 positions shown · non-contrast
Comparison: Portable chest x-ray of 08/22/2009

CLINICAL DATA: Cough, left-sided chest pain

CHEST - 2 VIEW

[view not recorded (1 of 3)]
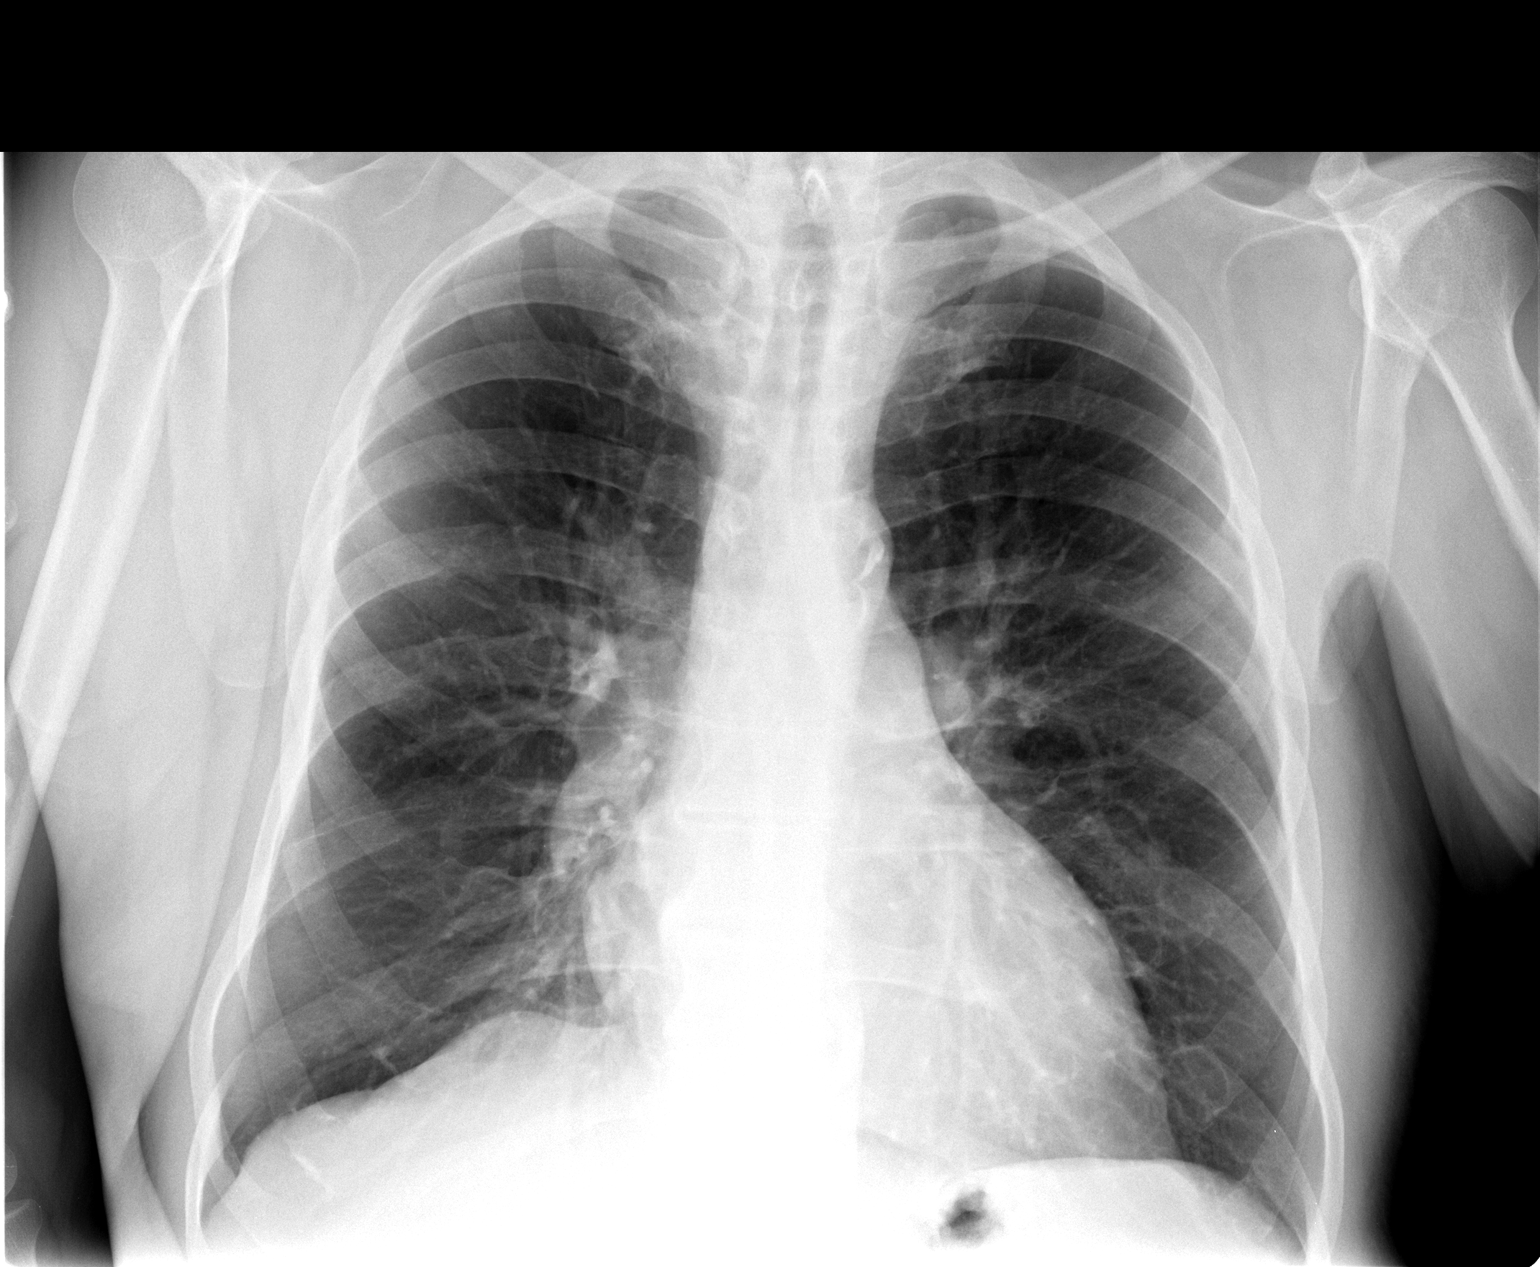

[view not recorded (2 of 3)]
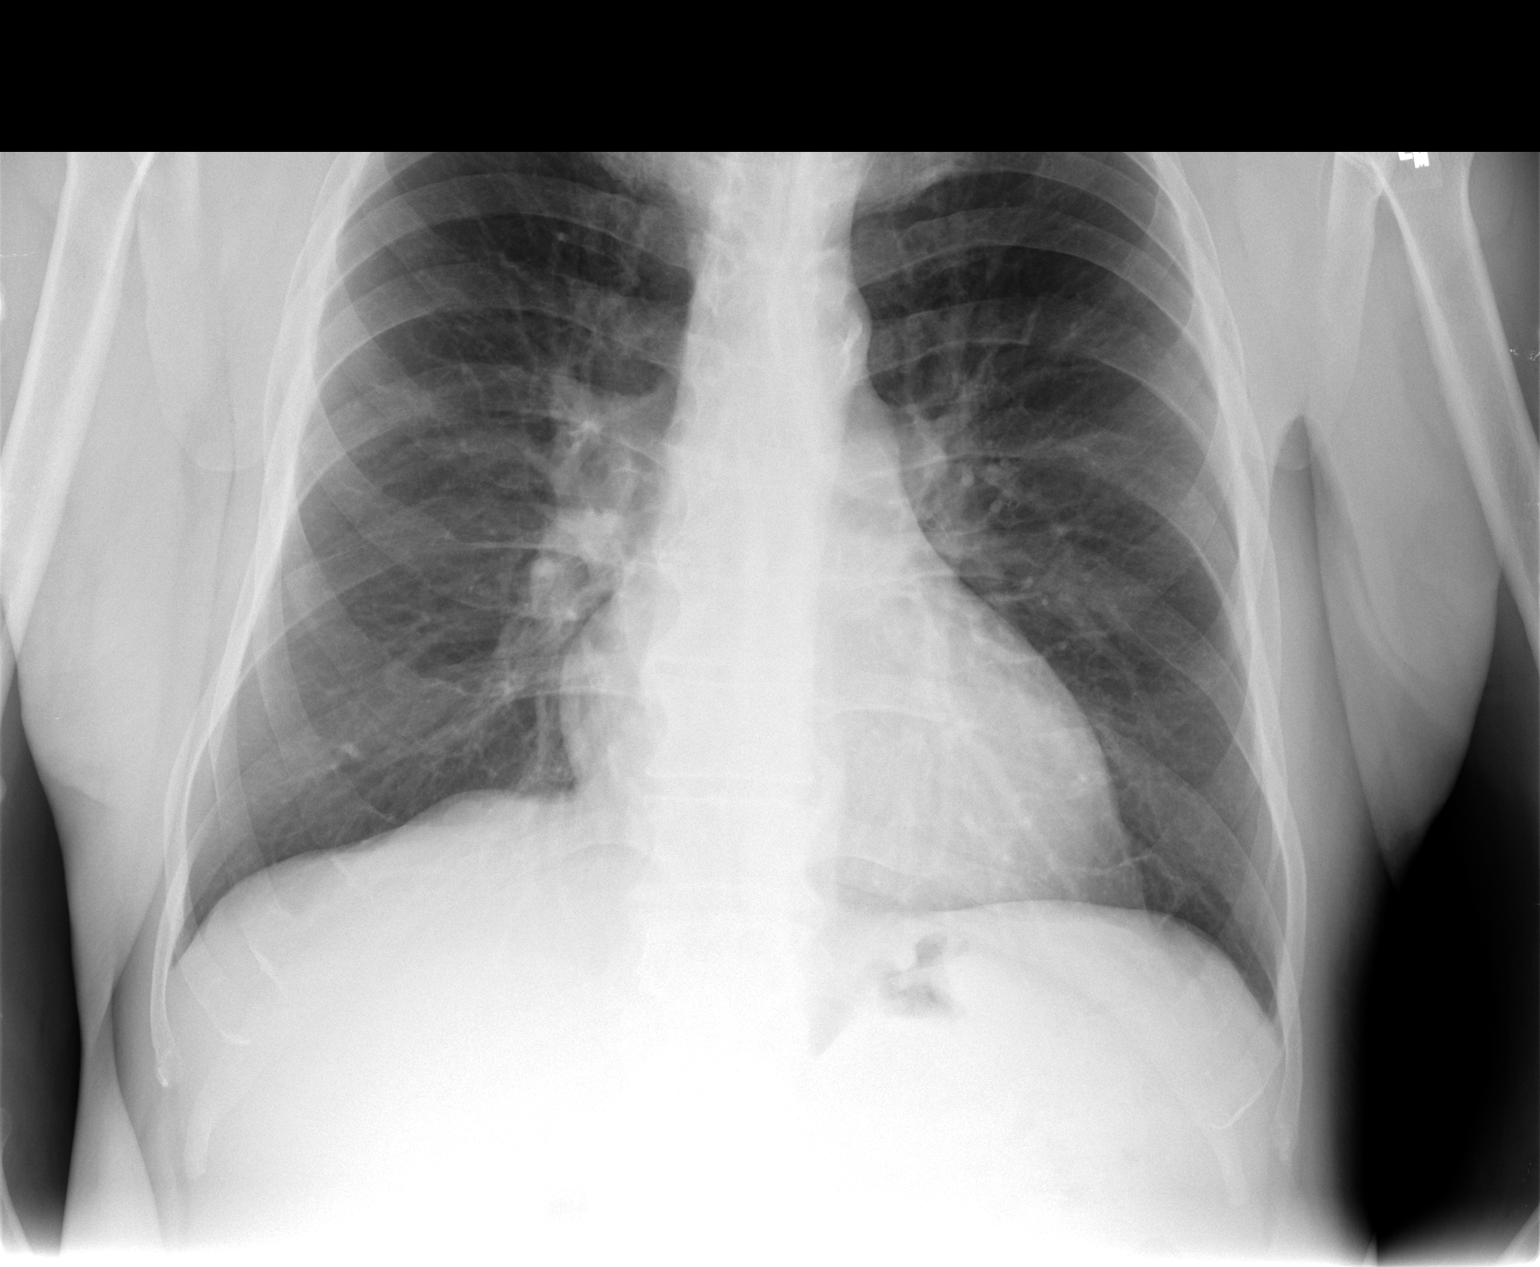

[view not recorded (3 of 3)]
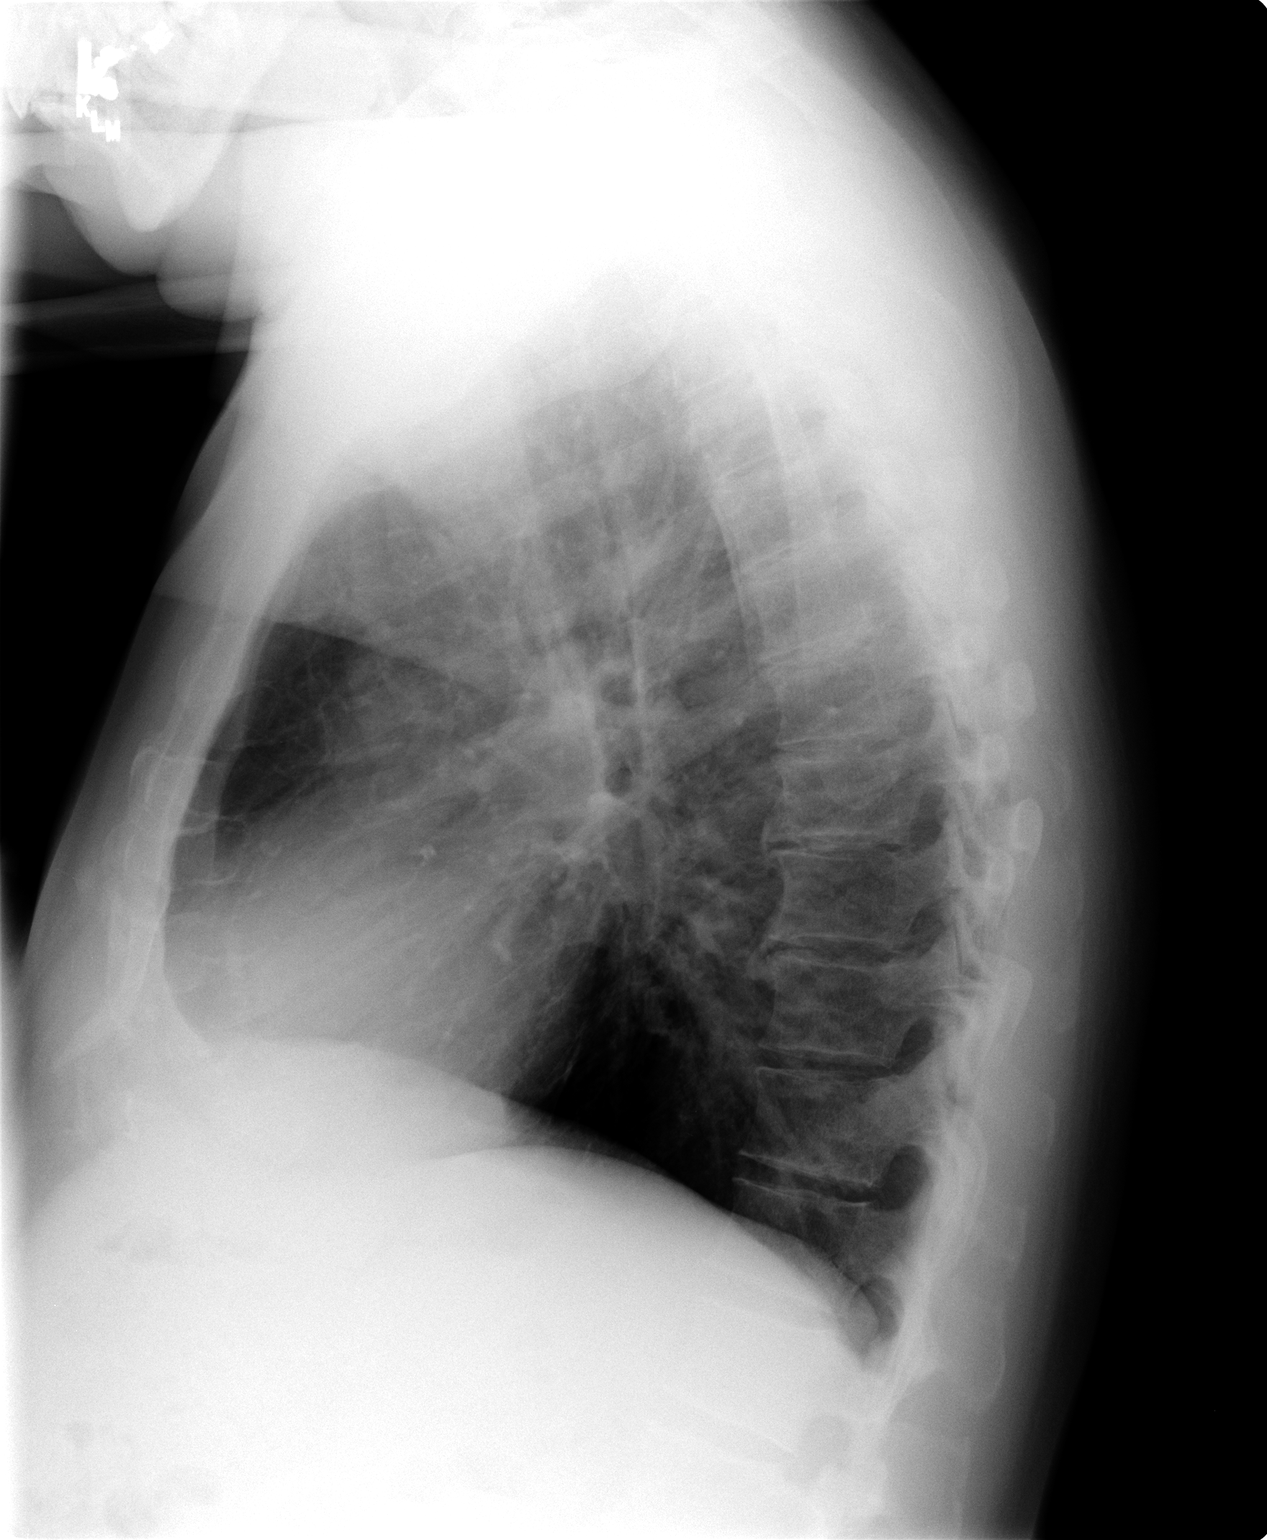

[3 of 3 positions shown; findings below may reference images not displayed]

FINDINGS: No active infiltrate or effusion is seen.  Mediastinal
contours appear normal.  Mild peribronchial thickening is not which
may indicate bronchitis, possibly chronic.  The heart is within
upper limits of normal.  There are degenerative changes in the
lower thoracic spine.
IMPRESSION: No active lung disease.  Question bronchitis.

## 2013-04-08 ENCOUNTER — Ambulatory Visit (INDEPENDENT_AMBULATORY_CARE_PROVIDER_SITE_OTHER): Payer: BC Managed Care – PPO | Admitting: Cardiovascular Disease

## 2013-04-08 ENCOUNTER — Encounter: Payer: Self-pay | Admitting: Cardiovascular Disease

## 2013-04-08 VITALS — BP 118/82 | HR 68 | Ht 66.0 in | Wt 155.0 lb

## 2013-04-08 DIAGNOSIS — I10 Essential (primary) hypertension: Secondary | ICD-10-CM

## 2013-04-08 DIAGNOSIS — E785 Hyperlipidemia, unspecified: Secondary | ICD-10-CM

## 2013-04-08 DIAGNOSIS — I2510 Atherosclerotic heart disease of native coronary artery without angina pectoris: Secondary | ICD-10-CM

## 2013-04-08 NOTE — Assessment & Plan Note (Signed)
Mr. Randalyn RheaVickers has known CAD status post remote LAD intervention in 1995. He had RCA stenting in 2009 and circumflex stenting in 2010. His last heart catheterization performed 05/20/12 was notable for a patent mid RCA stent, diffuse 60% distal RCA stenosis as well as 90% disease in his PDA and PLA branches. The circumflex was occluded in the mid AV groove, PA large patent ramus branch. His LAD was occluded after the first diagonal branch which had a 50-60% stenosis. His EF is 45-50% with moderate to severe anteroapical hypokinesia. When I saw him last his Lasix was 13 was complaining of chest pain on daily basis. He no longer has this. He was recently diagnosed with COPD.

## 2013-04-08 NOTE — Assessment & Plan Note (Signed)
Well controlled on her medications

## 2013-04-08 NOTE — Patient Instructions (Signed)
Your physician wants you to follow-up in: 6 months with an extender and 1 year with Dr Berry. You will receive a reminder letter in the mail two months in advance. If you don't receive a letter, please call our office to schedule the follow-up appointment.  

## 2013-04-08 NOTE — Progress Notes (Signed)
04/08/2013 Leonia Corona   [birth year]  [id number]  Primary Physician Raenette Rover Primary Cardiologist: .[email or web address]   HPI:  The patient is a 58 -year-old thin appearing divorced Caucasian male with no children who has a history of remote LAD intervention in 1995. He had RCA stenting in 2009. In May 2010 I stented his circumflex in 2 places. He has a 99% recanalized long segmental mid LAD which is old as well as diagonal branch disease. He also has PDA and PLA disease. His EF is in the 45% range. He has had accelerated angina which was nitrate responsive. I cathed him on February 17 revealing an EF of 45-50% with moderate to severe anteroapical hypokinesia, a patent mid RCA stent with PDA and PLA disease, a totally occluded mid AV groove circumflex stent, and unchanged LAD and diagonal branch anatomy. I was contemplating complete revascularization at that time. The patient continues to have 3-4 episodes of self-limited chest pain daily. Since I saw him 9 months ago he suffered a stroke on 12/09/12 with left-sided residua. He clearly is filing for disability. He no longer has chest pain in the frequency or severity that he had when I saw him last     Current Outpatient Prescriptions  Medication Sig Dispense Refill  . amLODipine (NORVASC) 5 MG tablet Take 5 mg by mouth daily.      Marland Kitchen aspirin 325 MG tablet Take 325 mg by mouth daily.      . celecoxib (CELEBREX) 50 MG capsule Take 50 mg by mouth 2 (two) times daily as needed. Arthritis pain      . clopidogrel (PLAVIX) 75 MG tablet Take 150 mg by mouth daily.       . cyanocobalamin 500 MCG tablet Take 1,000 mcg by mouth daily.       . fexofenadine (ALLEGRA) 180 MG tablet Take 180 mg by mouth daily.      . finasteride (PROSCAR) 5 MG tablet Take 5 mg by mouth daily.      . furosemide (LASIX) 20 MG tablet Take 20 mg by mouth daily.      Marland Kitchen gabapentin (NEURONTIN) 300 MG capsule Take 300 mg by mouth 3 (three) times daily.      Marland Kitchen glimepiride (AMARYL) 4  MG tablet Take 4 mg by mouth 2 (two) times daily.       . isosorbide dinitrate (ISORDIL) 20 MG tablet Take 20 mg by mouth 3 (three) times daily.      . metFORMIN (GLUCOPHAGE) 1000 MG tablet Take 1,000 mg by mouth 2 (two) times daily with a meal.      . metoprolol succinate (TOPROL-XL) 50 MG 24 hr tablet Take 50 mg by mouth 2 (two) times daily. Take with or immediately following a meal.      . roflumilast (DALIRESP) 500 MCG TABS tablet Take 500 mcg by mouth daily.      . rosuvastatin (CRESTOR) 10 MG tablet Take 10 mg by mouth daily.      Marland Kitchen tiotropium (SPIRIVA) 18 MCG inhalation capsule Place 18 mcg into inhaler and inhale daily.       No current facility-administered medications for this visit.    Allergies  Allergen Reactions  . Niacin And Related Other (See Comments)    Feels like body is burning  . Statins Other (See Comments)    diarrhea    History   Social History  . Marital Status: Divorced    Spouse Name: N/A    Number of  Children: N/A  . Years of Education: N/A   Occupational History  . Not on file.   Social History Main Topics  . Smoking status: Never Smoker   . Smokeless tobacco: Never Used  . Alcohol Use: No  . Drug Use: No  . Sexual Activity: Not on file   Other Topics Concern  . Not on file   Social History Narrative  . No narrative on file     Review of Systems: General: negative for chills, fever, night sweats or weight changes.  Cardiovascular: negative for chest pain, dyspnea on exertion, edema, orthopnea, palpitations, paroxysmal nocturnal dyspnea or shortness of breath Dermatological: negative for rash Respiratory: negative for cough or wheezing Urologic: negative for hematuria Abdominal: negative for nausea, vomiting, diarrhea, bright red blood per rectum, melena, or hematemesis Neurologic: negative for visual changes, syncope, or dizziness All other systems reviewed and are otherwise negative except as noted above.    Blood pressure 118/82,  pulse 68, height 5\' 6"  (1.676 m), weight 155 lb (70.308 kg).  General appearance: alert and no distress Neck: no adenopathy, no carotid bruit, no JVD, supple, symmetrical, trachea midline and thyroid not enlarged, symmetric, no tenderness/mass/nodules Lungs: clear to auscultation bilaterally Heart: regular rate and rhythm, S1, S2 normal, no murmur, click, rub or gallop Extremities: extremities normal, atraumatic, no cyanosis or edema  EKG not performed today  ASSESSMENT AND PLAN:   CAD, multiple vessel Mr. Randalyn RheaVickers has known CAD status post remote LAD intervention in 1995. He had RCA stenting in 2009 and circumflex stenting in 2010. His last heart catheterization performed 05/20/12 was notable for a patent mid RCA stent, diffuse 60% distal RCA stenosis as well as 90% disease in his PDA and PLA branches. The circumflex was occluded in the mid AV groove, PA large patent ramus branch. His LAD was occluded after the first diagonal branch which had a 50-60% stenosis. His EF is 45-50% with moderate to severe anteroapical hypokinesia. When I saw him last his Lasix was 13 was complaining of chest pain on daily basis. He no longer has this. He was recently diagnosed with COPD.  HTN (hypertension) Well controlled on her medications  Hyperlipidemia LDL goal <70 On statin therapy followed by his PCP      Runell GessJonathan J. Navid Lenzen MD [facility] Wichita FallsFACP,FACC,FAHA, [facility] Surgical CenterFSCAI 04/08/2013 12:01 PM

## 2013-04-08 NOTE — Assessment & Plan Note (Addendum)
On statin therapy followed by his PCP 

## 2013-04-09 ENCOUNTER — Ambulatory Visit (INDEPENDENT_AMBULATORY_CARE_PROVIDER_SITE_OTHER): Payer: BC Managed Care – PPO

## 2013-04-09 VITALS — BP 103/65 | HR 82 | Resp 18

## 2013-04-09 DIAGNOSIS — E1140 Type 2 diabetes mellitus with diabetic neuropathy, unspecified: Secondary | ICD-10-CM

## 2013-04-09 DIAGNOSIS — M2040 Other hammer toe(s) (acquired), unspecified foot: Secondary | ICD-10-CM

## 2013-04-09 DIAGNOSIS — E1149 Type 2 diabetes mellitus with other diabetic neurological complication: Secondary | ICD-10-CM

## 2013-04-09 DIAGNOSIS — E1142 Type 2 diabetes mellitus with diabetic polyneuropathy: Secondary | ICD-10-CM

## 2013-04-09 DIAGNOSIS — M79609 Pain in unspecified limb: Secondary | ICD-10-CM

## 2013-04-09 DIAGNOSIS — Q828 Other specified congenital malformations of skin: Secondary | ICD-10-CM

## 2013-04-09 DIAGNOSIS — B351 Tinea unguium: Secondary | ICD-10-CM

## 2013-04-09 NOTE — Progress Notes (Signed)
   Subjective:    Patient ID: Douglas Adkins, male    DOB: Apr 04, 1955, 58 y.o.   MRN: [id number]  HPI I am here to get my toenails and calluses trimmed up    Review of Systems no new findings or changes noted    Objective:   Physical Exam Vascular status is intact with pedal pulses palpable DP +2/4 bilateral PT plus one over 4 bilateral. Refill time 3 seconds all digits. Skin temperature warm turgor normal no edema rubor pallor or varicosities noted. Neurologically epicritic and proprioceptive sensations diminished on Semmes Weinstein testing plantar forefoot arch and digits. DTRs not elicited there is normal plantar response noted. Dermatologically skin color pigment normal hair growth absent there is nails are thick criptotic incurvated friable with discoloration 1 through 5 bilateral. Patient also has keratotic lesions sub-second left sub-second and fourth right with pinch callus of the right hip MTP area as well. On debridement keratoses sub-to a pinpoint bleeding was identified and treated with lidocaine Neosporin and Band-Aid. Patient wearing diabetic shoes with custom insoles the shoes are significantly worn and insoles are worn need replacing at this time patient was last Insall. Recommendation per patient request my recommendation is measurement and molding for new diabetic shoes and custom insoles and appropriate followup thereafter.       Assessment & Plan:  Assessment this time diabetes with history peripheral neuropathy history keratoses secondary digital contractures to plantar flexed metatarsals. At this time multiple nails painful mycotic friable discolored nails 1 through 5 bilateral debrided also debridement of multiple keratoses bilateral. Measurements for shoes and Boffo impressions for custom molded dual density Plastizote diabetic and weighs is carried out at this time. Patient be contacted when shoes inlays are ready for fitting with the next month followup in 3 months for  continued palliative care.  Alvan Dameichard Eylin Pontarelli DPM

## 2013-04-09 NOTE — Patient Instructions (Signed)
Diabetes and Foot Care Diabetes may cause you to have problems because of poor blood supply (circulation) to your feet and legs. This may cause the skin on your feet to become thinner, break easier, and heal more slowly. Your skin may become dry, and the skin may peel and crack. You may also have nerve damage in your legs and feet causing decreased feeling in them. You may not notice minor injuries to your feet that could lead to infections or more serious problems. Taking care of your feet is one of the most important things you can do for yourself.  HOME CARE INSTRUCTIONS  Wear shoes at all times, even in the house. Do not go barefoot. Bare feet are easily injured.  Check your feet daily for blisters, cuts, and redness. If you cannot see the bottom of your feet, use a mirror or ask someone for help.  Wash your feet with warm water (do not use hot water) and mild soap. Then pat your feet and the areas between your toes until they are completely dry. Do not soak your feet as this can dry your skin.  Apply a moisturizing lotion or petroleum jelly (that does not contain alcohol and is unscented) to the skin on your feet and to dry, brittle toenails. Do not apply lotion between your toes.  Trim your toenails straight across. Do not dig under them or around the cuticle. File the edges of your nails with an emery board or nail file.  Do not cut corns or calluses or try to remove them with medicine.  Wear clean socks or stockings every day. Make sure they are not too tight. Do not wear knee-high stockings since they may decrease blood flow to your legs.  Wear shoes that fit properly and have enough cushioning. To break in new shoes, wear them for just a few hours a day. This prevents you from injuring your feet. Always look in your shoes before you put them on to be sure there are no objects inside.  Do not cross your legs. This may decrease the blood flow to your feet.  If you find a minor scrape,  cut, or break in the skin on your feet, keep it and the skin around it clean and dry. These areas may be cleansed with mild soap and water. Do not cleanse the area with peroxide, alcohol, or iodine.  When you remove an adhesive bandage, be sure not to damage the skin around it.  If you have a wound, look at it several times a day to make sure it is healing.  Do not use heating pads or hot water bottles. They may burn your skin. If you have lost feeling in your feet or legs, you may not know it is happening until it is too late.  Make sure your health care provider performs a complete foot exam at least annually or more often if you have foot problems. Report any cuts, sores, or bruises to your health care provider immediately. SEEK MEDICAL CARE IF:   You have an injury that is not healing.  You have cuts or breaks in the skin.  You have an ingrown nail.  You notice redness on your legs or feet.  You feel burning or tingling in your legs or feet.  You have pain or cramps in your legs and feet.  Your legs or feet are numb.  Your feet always feel cold. SEEK IMMEDIATE MEDICAL CARE IF:   There is increasing redness,   swelling, or pain in or around a wound.  There is a red line that goes up your leg.  Pus is coming from a wound.  You develop a fever or as directed by your health care provider.  You notice a bad smell coming from an ulcer or wound. Document Released: 03/17/2000 Document Revised: 11/20/2012 Document Reviewed: 08/27/2012 [employer] Patient Information 2014 [employer].  

## 2013-06-16 ENCOUNTER — Telehealth: Payer: Self-pay | Admitting: *Deleted

## 2013-06-16 NOTE — Telephone Encounter (Signed)
I filled out the clinical portion of the papers.  Forms given to MR to send other requested information.

## 2013-07-09 ENCOUNTER — Ambulatory Visit (INDEPENDENT_AMBULATORY_CARE_PROVIDER_SITE_OTHER): Payer: BC Managed Care – PPO

## 2013-07-09 VITALS — BP 138/79 | HR 83 | Resp 18

## 2013-07-09 DIAGNOSIS — E1140 Type 2 diabetes mellitus with diabetic neuropathy, unspecified: Secondary | ICD-10-CM

## 2013-07-09 DIAGNOSIS — M79609 Pain in unspecified limb: Secondary | ICD-10-CM

## 2013-07-09 DIAGNOSIS — E1149 Type 2 diabetes mellitus with other diabetic neurological complication: Secondary | ICD-10-CM

## 2013-07-09 DIAGNOSIS — Q828 Other specified congenital malformations of skin: Secondary | ICD-10-CM

## 2013-07-09 DIAGNOSIS — B351 Tinea unguium: Secondary | ICD-10-CM

## 2013-07-09 DIAGNOSIS — E1142 Type 2 diabetes mellitus with diabetic polyneuropathy: Secondary | ICD-10-CM

## 2013-07-09 NOTE — Progress Notes (Signed)
   Subjective:    Patient ID: Douglas Adkins, male    DOB: 05/30/1955, 58 y.o.   MRN: [id number]  HPI I am here to get my toenails and calluses trimmed up and here to pick up my shoes    Review of Systems no new changes or findings at this time    Objective:   Physical Exam Lower extremity objective findings as follows vascular status is intact with pedal pulses palpable DP +2/4 bilateral PT plus one over 4 bilateral. Refill time 3 seconds all digits epicritic and proprioceptive sensations intact temperature normal no rubor pallor or varicosities noted there is decreased sensation Semmes Weinstein to the forefoot and digits is a normal plantar response neurologically skin color pigment normal hair growth absent nursing scrapes on the bottoms of both feet 2 weeks ago he went back and no night and slipped and fell in rectus feet against cabinet causes lacerations to the hallux and plantar foot area bilateral. Treating with Neosporin the are healing well slight eschar is are noted however no signs of infection no discharge drainage no open wounds or ulcerations are noted. Diffuse keratoses pinch callus first bilateral in sub-second and third MTP area left foot which are debrided as well as multiple dystrophic nails at this time       Assessment & Plan:  Assessment diabetes with peripheral neuropathy plan at this time debridement of multiple dystrophic probably orthotic nails 1 through 5 bilateral also debridement multiple keratoses secondary plantigrade metatarsals digital contractures. Subcutaneous left pinch callus first bilateral and distal fourth right. Also dispensed 1 pair of diabetic accident shoes 3 pairs of dual density Plastizote inlays which fit and contour well to the patient's foot with full contact and support being noted patient given instructions for use in the shoes breaking and followup with in 3 months for continued palliative care as recommended  Douglas Adkins DPM

## 2013-07-09 NOTE — Patient Instructions (Signed)
Diabetes and Foot Care Diabetes may cause you to have problems because of poor blood supply (circulation) to your feet and legs. This may cause the skin on your feet to become thinner, break easier, and heal more slowly. Your skin may become dry, and the skin may peel and crack. You may also have nerve damage in your legs and feet causing decreased feeling in them. You may not notice minor injuries to your feet that could lead to infections or more serious problems. Taking care of your feet is one of the most important things you can do for yourself.  HOME CARE INSTRUCTIONS  Wear shoes at all times, even in the house. Do not go barefoot. Bare feet are easily injured.  Check your feet daily for blisters, cuts, and redness. If you cannot see the bottom of your feet, use a mirror or ask someone for help.  Wash your feet with warm water (do not use hot water) and mild soap. Then pat your feet and the areas between your toes until they are completely dry. Do not soak your feet as this can dry your skin.  Apply a moisturizing lotion or petroleum jelly (that does not contain alcohol and is unscented) to the skin on your feet and to dry, brittle toenails. Do not apply lotion between your toes.  Trim your toenails straight across. Do not dig under them or around the cuticle. File the edges of your nails with an emery board or nail file.  Do not cut corns or calluses or try to remove them with medicine.  Wear clean socks or stockings every day. Make sure they are not too tight. Do not wear knee-high stockings since they may decrease blood flow to your legs.  Wear shoes that fit properly and have enough cushioning. To break in new shoes, wear them for just a few hours a day. This prevents you from injuring your feet. Always look in your shoes before you put them on to be sure there are no objects inside.  Do not cross your legs. This may decrease the blood flow to your feet.  If you find a minor scrape,  cut, or break in the skin on your feet, keep it and the skin around it clean and dry. These areas may be cleansed with mild soap and water. Do not cleanse the area with peroxide, alcohol, or iodine.  When you remove an adhesive bandage, be sure not to damage the skin around it.  If you have a wound, look at it several times a day to make sure it is healing.  Do not use heating pads or hot water bottles. They may burn your skin. If you have lost feeling in your feet or legs, you may not know it is happening until it is too late.  Make sure your health care provider performs a complete foot exam at least annually or more often if you have foot problems. Report any cuts, sores, or bruises to your health care provider immediately. SEEK MEDICAL CARE IF:   You have an injury that is not healing.  You have cuts or breaks in the skin.  You have an ingrown nail.  You notice redness on your legs or feet.  You feel burning or tingling in your legs or feet.  You have pain or cramps in your legs and feet.  Your legs or feet are numb.  Your feet always feel cold. SEEK IMMEDIATE MEDICAL CARE IF:   There is increasing redness,   swelling, or pain in or around a wound.  There is a red line that goes up your leg.  Pus is coming from a wound.  You develop a fever or as directed by your health care provider.  You notice a bad smell coming from an ulcer or wound. Document Released: 03/17/2000 Document Revised: 11/20/2012 Document Reviewed: 08/27/2012 [employer] Patient Information 2014 [employer].  

## 2013-07-23 DIAGNOSIS — Z0289 Encounter for other administrative examinations: Secondary | ICD-10-CM

## 2013-08-27 DIAGNOSIS — M79609 Pain in unspecified limb: Secondary | ICD-10-CM

## 2013-10-08 ENCOUNTER — Ambulatory Visit (INDEPENDENT_AMBULATORY_CARE_PROVIDER_SITE_OTHER): Payer: Medicaid Other

## 2013-10-08 VITALS — BP 115/57 | HR 69 | Resp 18

## 2013-10-08 DIAGNOSIS — M79609 Pain in unspecified limb: Secondary | ICD-10-CM

## 2013-10-08 DIAGNOSIS — E1149 Type 2 diabetes mellitus with other diabetic neurological complication: Secondary | ICD-10-CM

## 2013-10-08 DIAGNOSIS — Q828 Other specified congenital malformations of skin: Secondary | ICD-10-CM

## 2013-10-08 DIAGNOSIS — E1140 Type 2 diabetes mellitus with diabetic neuropathy, unspecified: Secondary | ICD-10-CM

## 2013-10-08 DIAGNOSIS — B351 Tinea unguium: Secondary | ICD-10-CM

## 2013-10-08 DIAGNOSIS — M79606 Pain in leg, unspecified: Secondary | ICD-10-CM

## 2013-10-08 NOTE — Progress Notes (Signed)
   Subjective:    Patient ID: Douglas Adkins, male    DOB: [birth year], 58 y.o.   MRN: [id number]  HPI I AM HERE TO GET MY TOENAILS TRIMMED UP     Review of Systems no systemic changes or findings noted     Objective:   Physical Exam Lower extremity objective findings unchanged pedal pulses are palpable DP +2/4 bilateral PT one over 4 bilateral capillary refill timed 3-4 seconds all digits skin temperature is warm turgor normal no edema rubor pallor noted mild varicosities noted neurologically epicritic and proprioceptive sensations to be intact although diminished on Semmes Weinstein testing to forefoot digits and arch. There is the keratotic lesion sub-third fourth MTP area on the right foot also pinch callus in the first MTP area plantarly. Nails thick yellow brittle crumbly and friable 1 through 5 bilateral with tenderness on palpation and inflow shoewear no open wounds ulcerations no secondary infection is noted current time.       Assessment & Plan:  Assessment diabetes with history peripheral neuropathy and complications thick crumbly friable mycotic nails 1 through 5 bilateral debrided at this time and the presence of pain in symptomology as was diabetes. Also debridement multiple keratoses sub-1 and 4 right foot. Patient to continue with appropriate accommodative shoe gear and insoles at all times reappointed 3 months for continued palliative care in the future as needed next  Alvan Dameichard Maicol Bowland DPM

## 2013-10-08 NOTE — Patient Instructions (Signed)
Diabetes and Foot Care Diabetes may cause you to have problems because of poor blood supply (circulation) to your feet and legs. This may cause the skin on your feet to become thinner, break easier, and heal more slowly. Your skin may become dry, and the skin may peel and crack. You may also have nerve damage in your legs and feet causing decreased feeling in them. You may not notice minor injuries to your feet that could lead to infections or more serious problems. Taking care of your feet is one of the most important things you can do for yourself.  HOME CARE INSTRUCTIONS  Wear shoes at all times, even in the house. Do not go barefoot. Bare feet are easily injured.  Check your feet daily for blisters, cuts, and redness. If you cannot see the bottom of your feet, use a mirror or ask someone for help.  Wash your feet with warm water (do not use hot water) and mild soap. Then pat your feet and the areas between your toes until they are completely dry. Do not soak your feet as this can dry your skin.  Apply a moisturizing lotion or petroleum jelly (that does not contain alcohol and is unscented) to the skin on your feet and to dry, brittle toenails. Do not apply lotion between your toes.  Trim your toenails straight across. Do not dig under them or around the cuticle. File the edges of your nails with an emery board or nail file.  Do not cut corns or calluses or try to remove them with medicine.  Wear clean socks or stockings every day. Make sure they are not too tight. Do not wear knee-high stockings since they may decrease blood flow to your legs.  Wear shoes that fit properly and have enough cushioning. To break in new shoes, wear them for just a few hours a day. This prevents you from injuring your feet. Always look in your shoes before you put them on to be sure there are no objects inside.  Do not cross your legs. This may decrease the blood flow to your feet.  If you find a minor scrape,  cut, or break in the skin on your feet, keep it and the skin around it clean and dry. These areas may be cleansed with mild soap and water. Do not cleanse the area with peroxide, alcohol, or iodine.  When you remove an adhesive bandage, be sure not to damage the skin around it.  If you have a wound, look at it several times a day to make sure it is healing.  Do not use heating pads or hot water bottles. They may burn your skin. If you have lost feeling in your feet or legs, you may not know it is happening until it is too late.  Make sure your health care provider performs a complete foot exam at least annually or more often if you have foot problems. Report any cuts, sores, or bruises to your health care provider immediately. SEEK MEDICAL CARE IF:   You have an injury that is not healing.  You have cuts or breaks in the skin.  You have an ingrown nail.  You notice redness on your legs or feet.  You feel burning or tingling in your legs or feet.  You have pain or cramps in your legs and feet.  Your legs or feet are numb.  Your feet always feel cold. SEEK IMMEDIATE MEDICAL CARE IF:   There is increasing redness,   swelling, or pain in or around a wound.  There is a red line that goes up your leg.  Pus is coming from a wound.  You develop a fever or as directed by your health care provider.  You notice a bad smell coming from an ulcer or wound. Document Released: 03/17/2000 Document Revised: 11/20/2012 Document Reviewed: 08/27/2012 [employer] Patient Information 2015 [employer]. This information is not intended to replace advice given to you by your health care provider. Make sure you discuss any questions you have with your health care provider.  

## 2013-12-10 ENCOUNTER — Telehealth: Payer: Self-pay

## 2013-12-10 NOTE — Telephone Encounter (Signed)
MEDICAL RECORDS NEEDED AND QUESTIONS ABOUT FORMS

## 2014-01-05 ENCOUNTER — Ambulatory Visit (INDEPENDENT_AMBULATORY_CARE_PROVIDER_SITE_OTHER): Payer: Medicaid Other

## 2014-01-05 DIAGNOSIS — M79606 Pain in leg, unspecified: Secondary | ICD-10-CM

## 2014-01-05 DIAGNOSIS — M2040 Other hammer toe(s) (acquired), unspecified foot: Secondary | ICD-10-CM

## 2014-01-05 DIAGNOSIS — B351 Tinea unguium: Secondary | ICD-10-CM

## 2014-01-05 DIAGNOSIS — Q828 Other specified congenital malformations of skin: Secondary | ICD-10-CM

## 2014-01-05 DIAGNOSIS — E1140 Type 2 diabetes mellitus with diabetic neuropathy, unspecified: Secondary | ICD-10-CM

## 2014-01-05 NOTE — Patient Instructions (Signed)
Diabetes and Foot Care Diabetes may cause you to have problems because of poor blood supply (circulation) to your feet and legs. This may cause the skin on your feet to become thinner, break easier, and heal more slowly. Your skin may become dry, and the skin may peel and crack. You may also have nerve damage in your legs and feet causing decreased feeling in them. You may not notice minor injuries to your feet that could lead to infections or more serious problems. Taking care of your feet is one of the most important things you can do for yourself.  HOME CARE INSTRUCTIONS  Wear shoes at all times, even in the house. Do not go barefoot. Bare feet are easily injured.  Check your feet daily for blisters, cuts, and redness. If you cannot see the bottom of your feet, use a mirror or ask someone for help.  Wash your feet with warm water (do not use hot water) and mild soap. Then pat your feet and the areas between your toes until they are completely dry. Do not soak your feet as this can dry your skin.  Apply a moisturizing lotion or petroleum jelly (that does not contain alcohol and is unscented) to the skin on your feet and to dry, brittle toenails. Do not apply lotion between your toes.  Trim your toenails straight across. Do not dig under them or around the cuticle. File the edges of your nails with an emery board or nail file.  Do not cut corns or calluses or try to remove them with medicine.  Wear clean socks or stockings every day. Make sure they are not too tight. Do not wear knee-high stockings since they may decrease blood flow to your legs.  Wear shoes that fit properly and have enough cushioning. To break in new shoes, wear them for just a few hours a day. This prevents you from injuring your feet. Always look in your shoes before you put them on to be sure there are no objects inside.  Do not cross your legs. This may decrease the blood flow to your feet.  If you find a minor scrape,  cut, or break in the skin on your feet, keep it and the skin around it clean and dry. These areas may be cleansed with mild soap and water. Do not cleanse the area with peroxide, alcohol, or iodine.  When you remove an adhesive bandage, be sure not to damage the skin around it.  If you have a wound, look at it several times a day to make sure it is healing.  Do not use heating pads or hot water bottles. They may burn your skin. If you have lost feeling in your feet or legs, you may not know it is happening until it is too late.  Make sure your health care provider performs a complete foot exam at least annually or more often if you have foot problems. Report any cuts, sores, or bruises to your health care provider immediately. SEEK MEDICAL CARE IF:   You have an injury that is not healing.  You have cuts or breaks in the skin.  You have an ingrown nail.  You notice redness on your legs or feet.  You feel burning or tingling in your legs or feet.  You have pain or cramps in your legs and feet.  Your legs or feet are numb.  Your feet always feel cold. SEEK IMMEDIATE MEDICAL CARE IF:   There is increasing redness,   swelling, or pain in or around a wound.  There is a red line that goes up your leg.  Pus is coming from a wound.  You develop a fever or as directed by your health care provider.  You notice a bad smell coming from an ulcer or wound. Document Released: 03/17/2000 Document Revised: 11/20/2012 Document Reviewed: 08/27/2012 [employer] Patient Information 2015 [employer]. This information is not intended to replace advice given to you by your health care provider. Make sure you discuss any questions you have with your health care provider.  

## 2014-01-05 NOTE — Progress Notes (Signed)
   Subjective:    Patient ID: Douglas Adkins, male    DOB: Mar 31, 1956, 58 y.o.   MRN: [id number]  HPI TOENAILS TRIM AND CHECK DIABETIC FEET.   Review of Systems no new findings or systemic changes noted     Objective:   Physical Exam Vascular status is unchanged pedal pulses DP +2 PT plus one over 4 bilateral capillary refill time 3 seconds temperature warm to cool no edema noted mild varicosities noted. Neurologically epicritic sensations diminished to the forefoot on Semmes Weinstein to the digits plantar arch area. Keratoses subsecond third MTP area on the right with a keratoses also pinch callus first right. No open wounds no ulcers nails thick brittle crumbly discolored friable 1 through 5 bilateral tender on palpation and with Pitocin incurvated shoe nails causing tenderness with enclosed shoe wear. No open wounds no secondary infection is noted at this time.       Assessment & Plan:  Assessment diabetes with history peripheral neuropathy and possibly angiopathy but crumbly friable dystrophic nails 1 through 4 bilateral debridement at this time the presence of pain in symptomology onychomycosis as well as diabetes and complications also this time debridement a keratotic lesion sub-third right. Keratotic lesion debridement also pinch callus of the right hallux is debrided reassess within the next 2-3 months for continued palliative care in the future as needed next  Alvan Dameichard Donnajean Chesnut DPM

## 2014-01-06 ENCOUNTER — Encounter: Payer: Self-pay | Admitting: Cardiology

## 2014-01-06 ENCOUNTER — Ambulatory Visit (INDEPENDENT_AMBULATORY_CARE_PROVIDER_SITE_OTHER): Payer: Medicaid Other | Admitting: Cardiology

## 2014-01-06 VITALS — BP 134/78 | HR 65 | Ht 66.0 in | Wt 157.2 lb

## 2014-01-06 DIAGNOSIS — I519 Heart disease, unspecified: Secondary | ICD-10-CM

## 2014-01-06 DIAGNOSIS — E785 Hyperlipidemia, unspecified: Secondary | ICD-10-CM

## 2014-01-06 DIAGNOSIS — I10 Essential (primary) hypertension: Secondary | ICD-10-CM

## 2014-01-06 DIAGNOSIS — I2510 Atherosclerotic heart disease of native coronary artery without angina pectoris: Secondary | ICD-10-CM

## 2014-01-06 DIAGNOSIS — E119 Type 2 diabetes mellitus without complications: Secondary | ICD-10-CM

## 2014-01-06 NOTE — Progress Notes (Signed)
01/06/2014   PCP: Raenette RoverStein, William   Chief Complaint  Patient presents with  . Follow-up    6 month visit. pt states that he does experience sob and swelling. pt states that he does have chest pain but "nothing to be concerned aout"    Primary Cardiologist:Dr. Erlene QuanJ. Berry   HPI:  58 -year-old thin appearing divorced Caucasian male with no children who has a history of remote LAD intervention in 1995. He had RCA stenting in 2009. In May 2010 I stented his circumflex in 2 places. He has a 99% recanalized long segmental mid LAD which is old as well as diagonal branch disease. He also has PDA and PLA disease. His EF is in the 45% range. He has had accelerated angina which was nitrate responsive. Last cath on February 17 revealing an EF of 45-50% with moderate to severe anteroapical hypokinesia, a patent mid RCA stent with PDA and PLA disease, a totally occluded mid AV groove circumflex stent, and unchanged LAD and diagonal branch anatomy. I was contemplating complete revascularization at that time. The patient continues to have 1 episodes of self-limited chest pain daily.   Last sen by Dr. Erlene QuanJ. Berry 04/08/13 after he suffered a stroke on 12/09/12 with left-sided residual. He is filing for disability. He no longer has chest pain in the frequency or severity that he had.  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   No changes in health since last visit.  Doing well ambulating with cane.     Allergies  Allergen Reactions  . Niacin And Related Other (See Comments)    Feels like body is burning  . Statins Other (See Comments)    diarrhea    Current Outpatient Prescriptions  Medication Sig Dispense Refill  . amLODipine (NORVASC) 5 MG tablet Take 5 mg by mouth daily.      Marland Kitchen. aspirin 325 MG tablet Take 325 mg by mouth daily.      . B-D ULTRAFINE III SHORT PEN 31G X 8 MM MISC       . celecoxib (CELEBREX) 50 MG capsule Take 50 mg by mouth 2 (two) times daily as needed. Arthritis pain      .  clopidogrel (PLAVIX) 75 MG tablet Take 150 mg by mouth daily.       . cyanocobalamin 500 MCG tablet Take 1,000 mcg by mouth daily.       . fexofenadine (ALLEGRA) 180 MG tablet Take 180 mg by mouth daily.      . finasteride (PROSCAR) 5 MG tablet Take 5 mg by mouth daily.      . furosemide (LASIX) 20 MG tablet Take 20 mg by mouth daily.      Marland Kitchen. gabapentin (NEURONTIN) 300 MG capsule Take 300 mg by mouth 3 (three) times daily.      . isosorbide dinitrate (ISORDIL) 20 MG tablet Take 20 mg by mouth 3 (three) times daily.      Marland Kitchen. LEVEMIR FLEXTOUCH 100 UNIT/ML Pen Inject 60 Units into the skin at bedtime.       . metoprolol succinate (TOPROL-XL) 50 MG 24 hr tablet Take 50 mg by mouth 2 (two) times daily. Take with or immediately following a meal.      . roflumilast (DALIRESP) 500 MCG TABS tablet Take 500 mcg by mouth daily.      . rosuvastatin (CRESTOR) 10 MG tablet Take 10 mg by mouth daily.       No current  facility-administered medications for this visit.    Past Medical History  Diagnosis Date  . CAD, multiple vessel     multiple PCIs; hx MI 1989; PTCA- LAD 1995; RCA stent 2009; stent to LCX X 2 sites 2010; chronic 99% recanalized long segmental mid LAD & diag branch dz, PDA & PLA diseasse.  Last Cath 2/17/14eith patent stent to RCA occl. AV groove stent. may need CABG in future  . Angina at rest     self limited pain daily  . DM (diabetes mellitus)   . HTN (hypertension)   . Hyperlipidemia LDL goal <70   . LV dysfunction     mild, EF 45% atcath 05/2012, last echo 08/23/2009 EF 40-45%  . Stroke     Past Surgical History  Procedure Laterality Date  . Coronary angioplasty  06/1993    PTCA to LAD  . Coronary angioplasty with stent placement  2009    stent-RCA  . Coronary angioplasty with stent placement  2010    stent -LCX X 2 sites  . Cardiac catheterization  05/20/2012     patent mid RCA stent with PDA and PLA disease, totally occluded mid AV groove circumflex stent, unchanged LAD and  diagonal branch anatomy with 99% recanalized long segmental mid LAD and diagonal branch disease. Moderate severe anterior apical hypokinesis EF 45%    ZOX:WRUEAVW:UJ colds or fevers, no weight changes Skin:no rashes or ulcers HEENT:no blurred vision, no congestion CV:see HPI PUL:see HPI GI:no diarrhea constipation or melena, no indigestion GU:no hematuria, no dysuria MS:no joint pain, no claudication Neuro:no syncope, no lightheadedness Endo:+ diabetes, no thyroid disease  Wt Readings from Last 3 Encounters:  01/06/14 157 lb 3.2 oz (71.305 kg)  04/08/13 155 lb (70.308 kg)  05/20/12 160 lb (72.576 kg)    PHYSICAL EXAM BP 134/78  Pulse 65  Ht 5\' 6"  (1.676 m)  Wt 157 lb 3.2 oz (71.305 kg)  BMI 25.38 kg/m2 General:Pleasant affect, NAD Skin:Warm and dry, brisk capillary refill HEENT:normocephalic, sclera clear, mucus membranes moist Neck:supple, no JVD, no bruits  Heart:S1S2 RRR without murmur, gallup, rub or click Lungs:clear without rales, rhonchi, or wheezes WJX:BJYN, non tender, + BS, do not palpate liver spleen or masses Ext:no lower ext edema, 2+ pedal pulses, 2+ radial pulses Neuro:alert and oriented, MAE, follows commands, + facial symmetry  EKG:SR rate of 65 rt axis, t wave inversions in V5-V6, ST depression in II, III, AVF.  No changes.   ASSESSMENT AND PLAN CAD, multiple vessel multiple PCIs; hx MI 1989; PTCA- LAD 1995; RCA stent 2009; stent to LCX X 2 sites 2010; chronic 99% recanalized long segmental mid LAD & diag branch dz, PDA & PLA diseasse.  Last Cath 2/17/14eith patent stent to RCA occl. AV groove stent. may need CABG in future Stable pain now.  Once daily.    Hyperlipidemia LDL goal <70 Followed by PCP:  LDL 70 HDL 37 in 03/2013 followed by PCP, stable on Crestor   LV dysfunction Euvolemic.  Stable on 20 mg Lasix daily.  HTN (hypertension) Controlled  DM (diabetes mellitus) Followed by PCP   Follow with Dr. Allyson Sabal in 6 months

## 2014-01-06 NOTE — Assessment & Plan Note (Signed)
Euvolemic.  Stable on 20 mg Lasix daily.

## 2014-01-06 NOTE — Assessment & Plan Note (Signed)
Followed by PCP

## 2014-01-06 NOTE — Assessment & Plan Note (Signed)
Followed by PCP:  LDL 70 HDL 37 in 03/2013 followed by PCP, stable on Crestor

## 2014-01-06 NOTE — Assessment & Plan Note (Signed)
multiple PCIs; hx MI 1989; PTCA- LAD 1995; RCA stent 2009; stent to LCX X 2 sites 2010; chronic 99% recanalized long segmental mid LAD & diag branch dz, PDA & PLA diseasse.  Last Cath 2/17/14eith patent stent to RCA occl. AV groove stent. may need CABG in future Stable pain now.  Once daily.

## 2014-01-06 NOTE — Patient Instructions (Signed)
Continue same medications   Your physician wants you to follow-up in: 6 months with Dr.Berry. You will receive a reminder letter in the mail two months in advance. If you don't receive a letter, please call our office to schedule the follow-up appointment.  

## 2014-01-06 NOTE — Assessment & Plan Note (Signed)
Controlled.  

## 2014-01-07 ENCOUNTER — Ambulatory Visit: Payer: Medicaid Other

## 2014-03-12 ENCOUNTER — Encounter (HOSPITAL_COMMUNITY): Payer: Self-pay | Admitting: Cardiovascular Disease

## 2014-04-09 ENCOUNTER — Ambulatory Visit (INDEPENDENT_AMBULATORY_CARE_PROVIDER_SITE_OTHER): Payer: Medicaid Other

## 2014-04-09 VITALS — BP 138/92 | HR 69 | Resp 12

## 2014-04-09 DIAGNOSIS — M2040 Other hammer toe(s) (acquired), unspecified foot: Secondary | ICD-10-CM

## 2014-04-09 DIAGNOSIS — M79673 Pain in unspecified foot: Secondary | ICD-10-CM

## 2014-04-09 DIAGNOSIS — M79606 Pain in leg, unspecified: Secondary | ICD-10-CM

## 2014-04-09 DIAGNOSIS — B351 Tinea unguium: Secondary | ICD-10-CM

## 2014-04-09 DIAGNOSIS — Q828 Other specified congenital malformations of skin: Secondary | ICD-10-CM

## 2014-04-09 DIAGNOSIS — E1140 Type 2 diabetes mellitus with diabetic neuropathy, unspecified: Secondary | ICD-10-CM

## 2014-04-09 NOTE — Progress Notes (Signed)
   Subjective:    Patient ID: Douglas Adkins, male    DOB: Jan 22, 1956, 59 y.o.   MRN: [id number]  HPI  TRIM MY TOENAILS AND CHECK MY FEET  Review of Systems neurovascular status unchanged pedal pulses DP +2 PT plus one over 4 Refill time 3 seconds. There is some mild edema noted keratoses sub-third MTP area right with hemorrhage keratoses noted no open wounds no ulcers no secondary infection nails thick brittle Crumley friable dystrophic 1 through 5 bilateral also some pinch callus and first right MTP area. 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 Patient did advise is he is now receiving disability benefits    Alvan Dameichard Leina Babe DPM      Assessment & Plan:

## 2014-07-09 ENCOUNTER — Ambulatory Visit: Payer: Medicaid Other

## 2014-07-16 ENCOUNTER — Ambulatory Visit (INDEPENDENT_AMBULATORY_CARE_PROVIDER_SITE_OTHER): Payer: 59

## 2014-07-16 VITALS — BP 133/70 | HR 71 | Resp 18

## 2014-07-16 DIAGNOSIS — B351 Tinea unguium: Secondary | ICD-10-CM | POA: Diagnosis not present

## 2014-07-16 DIAGNOSIS — E1140 Type 2 diabetes mellitus with diabetic neuropathy, unspecified: Secondary | ICD-10-CM

## 2014-07-16 DIAGNOSIS — Q828 Other specified congenital malformations of skin: Secondary | ICD-10-CM

## 2014-07-16 DIAGNOSIS — M79606 Pain in leg, unspecified: Secondary | ICD-10-CM | POA: Diagnosis not present

## 2014-07-16 MED ORDER — GABAPENTIN 300 MG PO CAPS: 300.0000 mg | ORAL_CAPSULE | Freq: Every day | ORAL | Status: AC

## 2014-07-16 NOTE — Progress Notes (Signed)
   Subjective: Patient presents today for follow-up of diabetic foot and nail care. He's been taking the gabapentin as prescribed by his primary care physician relates he still has neuropathic type pain.  Objective: Neurovascular status is intact and unchanged with palpable pulses at 2/4 DP and PT bilateral. Mild edema is noted. Keratosis of metatarsal 5 and 3 of the right foot is noted. Patient's toenails are elongated, thickened, discolored, dystrophic and clinically mycotic.  Assessment: Peripheral neuropathy associated with diabetes, angiopathy, symptomatic mycotic toenails  Plan: Debridement of toenails accomplished today without complication. Recommended increasing his gabapentin and referred for him 300 mg to add to the 600 mg when necessary he takes. He will discuss this with his primary care physician to change the overall prescription of the gabapentin.

## 2014-11-16 ENCOUNTER — Ambulatory Visit (INDEPENDENT_AMBULATORY_CARE_PROVIDER_SITE_OTHER): Payer: 59 | Admitting: Podiatry

## 2014-11-16 DIAGNOSIS — B351 Tinea unguium: Secondary | ICD-10-CM

## 2014-11-16 DIAGNOSIS — M79676 Pain in unspecified toe(s): Secondary | ICD-10-CM | POA: Diagnosis not present

## 2014-11-16 DIAGNOSIS — Q828 Other specified congenital malformations of skin: Secondary | ICD-10-CM

## 2014-11-16 DIAGNOSIS — E1140 Type 2 diabetes mellitus with diabetic neuropathy, unspecified: Secondary | ICD-10-CM

## 2014-11-16 NOTE — Progress Notes (Signed)
Patient ID: Douglas Adkins, male   DOB: 1955-10-29, 59 y.o.   MRN: [id number] Complaint:  Visit Type: Patient returns to my office for continued preventative foot care services. Complaint: Patient states" my nails have grown long and thick and become painful to walk and wear shoes" Patient has been diagnosed with DM with neuropathy.. The patient presents for preventative foot care services. No changes to ROS.  Painful callus right forefoot. Podiatric Exam: Vascular: dorsalis pedis and posterior tibial pulses are palpable bilateral. Capillary return is immediate. Temperature gradient is WNL. Skin turgor WNL  Sensorium: Absent  Semmes Weinstein monofilament test. LOPS right WNL Nail Exam: Pt has thick disfigured discolored nails with subungual debris noted bilateral entire nail hallux through fifth toenails Ulcer Exam: There is no evidence of ulcer or pre-ulcerative changes or infection. Orthopedic Exam: Muscle tone and strength are WNL. No limitations in general ROM. No crepitus or effusions noted. Foot type and digits show no abnormalities. Bony prominences are unremarkable. Skin:  Porokeratosis sub3 right foot.. No infection or ulcers  Diagnosis:  Onychomycosis, , Pain in right toe, pain in left toes  Treatment & Plan Procedures and Treatment: Consent by patient was obtained for treatment procedures. The patient understood the discussion of treatment and procedures well. All questions were answered thoroughly reviewed. Debridement of mycotic and hypertrophic toenails, 1 through 5 bilateral and clearing of subungual debris. No ulceration, no infection noted. Requests diabetic Shoes.  Initiate paperwork. Return Visit-Office Procedure: Patient instructed to return to the office for a follow up visit 3 months for continued evaluation and treatment.

## 2015-02-15 ENCOUNTER — Encounter: Payer: Self-pay | Admitting: Podiatry

## 2015-02-15 ENCOUNTER — Ambulatory Visit (INDEPENDENT_AMBULATORY_CARE_PROVIDER_SITE_OTHER): Payer: 59 | Admitting: Podiatry

## 2015-02-15 ENCOUNTER — Ambulatory Visit (INDEPENDENT_AMBULATORY_CARE_PROVIDER_SITE_OTHER): Payer: 59

## 2015-02-15 DIAGNOSIS — R52 Pain, unspecified: Secondary | ICD-10-CM

## 2015-02-15 DIAGNOSIS — B351 Tinea unguium: Secondary | ICD-10-CM | POA: Diagnosis not present

## 2015-02-15 DIAGNOSIS — E1140 Type 2 diabetes mellitus with diabetic neuropathy, unspecified: Secondary | ICD-10-CM | POA: Diagnosis not present

## 2015-02-15 DIAGNOSIS — M79673 Pain in unspecified foot: Secondary | ICD-10-CM

## 2015-02-15 DIAGNOSIS — L89891 Pressure ulcer of other site, stage 1: Secondary | ICD-10-CM

## 2015-02-15 DIAGNOSIS — L97511 Non-pressure chronic ulcer of other part of right foot limited to breakdown of skin: Secondary | ICD-10-CM

## 2015-02-15 MED ORDER — CEPHALEXIN 500 MG PO CAPS: 500.0000 mg | ORAL_CAPSULE | Freq: Four times a day (QID) | ORAL | Status: AC

## 2015-02-15 NOTE — Progress Notes (Signed)
Subjective:     Patient ID: Douglas Adkins, male   DOB: 1955-10-27, 59 y.o.   MRN: [id number]  HPIThis patient presents to the office today for his preventive foot care services.  He says his right foot especially under the ball of his right foot has become very painful and limits his ability to walk.  He says his foot has also become very swollen over the last few days.  He has thick painful nails which are painful walking and wearing his shoes.  He also states he has had no contact with the office concerning his newly ordered diabetic shoes.   Review of Systems     Objective:   Physical Exam Vascular: dorsalis pedis and posterior tibial pulses are palpable bilateral. Capillary return is immediate. Temperature gradient is WNL. Skin turgor WNL  Sensorium: Absent Semmes Weinstein monofilament test. LOPS right WNL Nail Exam: Pt has thick disfigured discolored nails with subungual debris noted bilateral entire nail hallux through fifth toenails Ulcer Exam: His porokeratosis sub 3rd metatarsal right foot has become open exposing granulation tissue sub 3rd metatarsal right foot.  No drainage or malodor noted. Orthopedic Exam: Muscle tone and strength are WNL. No limitations in general ROM. No crepitus or effusions noted. Foot type and digits show no abnormalities. Bony prominences are unremarkable. Hammer toes 2-4 B/L. Skin: Porokeratosis sub3 right foot.. No infection or ulcers.  There is significant increased temperature and swelling noted right foot.  Probable infection noted.     Assessment:     Onychomycosis  Diabetic ulcer infected.     Plan:     Debridement of nails.  Debridement of necrotic tissue sub 3 right foot.  Neosporin/DSD.  Home instructions given.  X-ray taken and no FB noted.  Finally cephalexin was prescribed due to infection right foot.  If this condition worsens or becomes very painful, the patient was told to contact this office or go to the Emergency Department at the  hospital.Home instructions given.  Continue to work on diabetic shoes for this patient.  Douglas Adkins DPM

## 2015-02-22 ENCOUNTER — Encounter: Payer: Self-pay | Admitting: Podiatry

## 2015-02-22 ENCOUNTER — Ambulatory Visit (INDEPENDENT_AMBULATORY_CARE_PROVIDER_SITE_OTHER): Payer: 59 | Admitting: Podiatry

## 2015-02-22 VITALS — BP 150/74 | HR 83 | Resp 14

## 2015-02-22 DIAGNOSIS — L03119 Cellulitis of unspecified part of limb: Secondary | ICD-10-CM

## 2015-02-22 DIAGNOSIS — L97511 Non-pressure chronic ulcer of other part of right foot limited to breakdown of skin: Secondary | ICD-10-CM

## 2015-02-22 DIAGNOSIS — L02619 Cutaneous abscess of unspecified foot: Secondary | ICD-10-CM

## 2015-02-22 NOTE — Progress Notes (Signed)
Subjective:     Patient ID: Douglas Adkins, male   DOB: April 10, 1955, 59 y.o.   MRN: [id number]  HPI this patient returns to the office following a diagnosis of his right foot as well as a diabetic ulcer on the bottom of his right foot. He was given instructions for home soaks and treated with cephalexin. He states after the second day his right foot started to improve and swelling started to go down. He presents the office today stating that he is not having any pain or discomfort in his foot and is able to walk pain free. He is pleased with his progress   Review of Systems     Objective:   Physical Exam GENERAL APPEARANCE: Alert, conversant. Appropriately groomed. No acute distress.  VASCULAR: Pedal pulses palpable at  [facility] and [facility] bilateral.  Capillary refill time is immediate to all digits,  Normal temperature gradient.  Digital hair growth is present bilateral  NEUROLOGIC: sensation is normal to 5.07 monofilament at 5/5 sites bilateral.  Light touch is intact bilateral, Muscle strength normal.  MUSCULOSKELETAL: acceptable muscle strength, tone and stability bilateral.  Intrinsic muscluature intact bilateral.  Rectus appearance of foot and digits noted bilateral.   DERMATOLOGIC: skin color, texture, and turgor are within normal limits.  No preulcerative lesions or ulcers  are seen, no interdigital maceration noted.  No open lesions present.  Digital nails are asymptomatic. No drainage noted. Diabetic ulcer right forefoot has closed with peeling noted at the site of the ulcer.  There is no redness or swelling or increased temperature dorsum of right foot.Diabetic Ulcer right foot   Cellulitis right foot.      Assessment:     Diabetic Ulcer right foot  Cellulitis right foot.  ROV  Continue antibiotics.  Discontinue soaks.  RTC for nailcare as needed.  Helane GuntherGregory Leiam Hopwood DPM    Plan:

## 2015-03-17 ENCOUNTER — Ambulatory Visit: Payer: 59 | Admitting: *Deleted

## 2015-03-17 DIAGNOSIS — E1140 Type 2 diabetes mellitus with diabetic neuropathy, unspecified: Secondary | ICD-10-CM

## 2015-03-17 NOTE — Progress Notes (Signed)
Patient ID: Douglas Adkins, male   DOB: Apr 23, 1955, 59 y.o.   MRN: [id number] Patient presents to be scanned and measured for diabetic shoes and inserts.

## 2015-03-31 ENCOUNTER — Ambulatory Visit: Payer: 59

## 2015-03-31 NOTE — Patient Instructions (Signed)

## 2015-03-31 NOTE — Progress Notes (Unsigned)
Patient ID: Douglas Adkins, male   DOB: July 20, 1955, 59 y.o.   MRN: [id number] Patient presents for diabetic shoe pick up, shoes are tried on for good fit.  Patient received 1 Pair Apex [id number] Ariya Mock toe black in men's 8 wide and 3 pairs custom molded diabetic inserts.  Verbal and written break in and wear instructions given.  Patient will follow up for scheduled routine care.

## 2015-04-07 ENCOUNTER — Ambulatory Visit (INDEPENDENT_AMBULATORY_CARE_PROVIDER_SITE_OTHER): Payer: BLUE CROSS/BLUE SHIELD | Admitting: Sports Medicine

## 2015-04-07 DIAGNOSIS — M2012 Hallux valgus (acquired), left foot: Secondary | ICD-10-CM

## 2015-04-07 DIAGNOSIS — M2041 Other hammer toe(s) (acquired), right foot: Secondary | ICD-10-CM

## 2015-04-07 DIAGNOSIS — M2011 Hallux valgus (acquired), right foot: Secondary | ICD-10-CM

## 2015-04-07 DIAGNOSIS — M2040 Other hammer toe(s) (acquired), unspecified foot: Secondary | ICD-10-CM | POA: Diagnosis not present

## 2015-04-07 DIAGNOSIS — L84 Corns and callosities: Secondary | ICD-10-CM

## 2015-04-07 DIAGNOSIS — M2010 Hallux valgus (acquired), unspecified foot: Secondary | ICD-10-CM

## 2015-04-07 DIAGNOSIS — M2042 Other hammer toe(s) (acquired), left foot: Secondary | ICD-10-CM | POA: Diagnosis not present

## 2015-04-07 DIAGNOSIS — E1140 Type 2 diabetes mellitus with diabetic neuropathy, unspecified: Secondary | ICD-10-CM | POA: Diagnosis not present

## 2015-04-07 NOTE — Patient Instructions (Signed)

## 2015-04-07 NOTE — Progress Notes (Signed)
Patient ID: Douglas CoronaDouglas R Corti, male   DOB: 10-09-1955, 60 y.o.   MRN: [id number] Patient presents for diabetic shoe pick up, shoes are tried on for good fit.  Patient received 1 Pair Apex Moc toe casual walker black  In men's size 8 wide and 3 pairs custom molded diabetic inserts.  Verbal and written break in and wear instructions given.  Patient will follow up for scheduled routine care.   Patient discussed with medical assistant. Agree with above. Patient to follow up as scheduled for continued care or sooner if problems or issues arise. -Dr. Marylene LandStover

## 2015-05-24 ENCOUNTER — Ambulatory Visit (INDEPENDENT_AMBULATORY_CARE_PROVIDER_SITE_OTHER): Payer: BLUE CROSS/BLUE SHIELD | Admitting: Podiatry

## 2015-05-24 ENCOUNTER — Encounter: Payer: Self-pay | Admitting: Podiatry

## 2015-05-24 VITALS — BP 121/78 | HR 88 | Resp 14

## 2015-05-24 DIAGNOSIS — Q828 Other specified congenital malformations of skin: Secondary | ICD-10-CM

## 2015-05-24 DIAGNOSIS — E1140 Type 2 diabetes mellitus with diabetic neuropathy, unspecified: Secondary | ICD-10-CM

## 2015-05-24 DIAGNOSIS — B351 Tinea unguium: Secondary | ICD-10-CM

## 2015-05-24 DIAGNOSIS — M79676 Pain in unspecified toe(s): Secondary | ICD-10-CM | POA: Diagnosis not present

## 2015-05-24 MED ORDER — GABAPENTIN 600 MG PO TABS: 600.0000 mg | ORAL_TABLET | Freq: Three times a day (TID) | ORAL | Status: DC

## 2015-05-24 NOTE — Progress Notes (Addendum)
Patient ID: Douglas Adkins, male   DOB: 1956-02-19, 60 y.o.   MRN: [id number] Complaint:  Visit Type: Patient returns to my office for continued preventative foot care services. Complaint: Patient states" my nails have grown long and thick and become painful to walk and wear shoes" Patient has been diagnosed with DM with neuropathy.. The patient presents for preventative foot care services. No changes to ROS.  Painful callus right forefoot. Patient is pleased with his diabetic shoes. Podiatric Exam: Vascular: dorsalis pedis and posterior tibial pulses are palpable bilateral. Capillary return is immediate. Temperature gradient is WNL. Skin turgor WNL  Sensorium: Absent  Semmes Weinstein monofilament test. LOPS right WNL Nail Exam: Pt has thick disfigured discolored nails with subungual debris noted bilateral entire nail hallux through fifth toenails Ulcer Exam: There is no evidence of ulcer or pre-ulcerative changes or infection. Orthopedic Exam: Muscle tone and strength are WNL. No limitations in general ROM. No crepitus or effusions noted. Foot type and digits show no abnormalities. Bony prominences are unremarkable. Skin:  Porokeratosis sub3 right foot.. No infection or ulcers  Diagnosis:  Onychomycosis, , Pain in right toe, pain in left toes  Treatment & Plan Procedures and Treatment: Consent by patient was obtained for treatment procedures. The patient understood the discussion of treatment and procedures well. All questions were answered thoroughly reviewed. Debridement of mycotic and hypertrophic toenails, 1 through 5 bilateral and clearing of subungual debris. No ulceration, no infection .  Refill for gabapentin.  Told patient he needs to talk to his medical doctor about further gabapentin refills. I told him I was uncomfortable treating patients with gabapentin.  His initial prescription was from Dr. Irving Shows.  Therefore I told him to see his medical doctor to properly prescribe gabapentin.   Refilled his prescription until he could see his MD. Return Visit-Office Procedure: Patient instructed to return to the office for a follow up visit 3 months for continued evaluation and treatment.   Helane Gunther DPM

## 2015-08-23 ENCOUNTER — Ambulatory Visit: Payer: BLUE CROSS/BLUE SHIELD | Admitting: Podiatry

## 2015-09-20 ENCOUNTER — Encounter: Payer: Self-pay | Admitting: Podiatry

## 2015-09-20 ENCOUNTER — Ambulatory Visit (INDEPENDENT_AMBULATORY_CARE_PROVIDER_SITE_OTHER): Payer: Medicare Other | Admitting: Podiatry

## 2015-09-20 DIAGNOSIS — M79676 Pain in unspecified toe(s): Secondary | ICD-10-CM | POA: Diagnosis not present

## 2015-09-20 DIAGNOSIS — B351 Tinea unguium: Secondary | ICD-10-CM

## 2015-09-20 DIAGNOSIS — E1140 Type 2 diabetes mellitus with diabetic neuropathy, unspecified: Secondary | ICD-10-CM | POA: Diagnosis not present

## 2015-09-20 DIAGNOSIS — Q828 Other specified congenital malformations of skin: Secondary | ICD-10-CM | POA: Diagnosis not present

## 2015-09-20 NOTE — Progress Notes (Signed)
Patient ID: Douglas Adkins, male   DOB: [birth year], 60 y.o.   MRN: [id number] Complaint:  Visit Type: Patient returns to my office for continued preventative foot care services. Complaint: Patient states" my nails have grown long and thick and become painful to walk and wear shoes" Patient has been diagnosed with DM with neuropathy.. The patient presents for preventative foot care services. No changes to ROS.  Painful callus right forefoot.  Podiatric Exam: Vascular: dorsalis pedis and posterior tibial pulses are palpable bilateral. Capillary return is immediate. Temperature gradient is WNL. Skin turgor WNL  Sensorium: Absent  Semmes Weinstein monofilament test. LOPS right WNL Nail Exam: Pt has thick disfigured discolored nails with subungual debris noted bilateral entire nail hallux through fifth toenails Ulcer Exam: There is no evidence of ulcer or pre-ulcerative changes or infection. Orthopedic Exam: Muscle tone and strength are WNL. No limitations in general ROM. No crepitus or effusions noted. Foot type and digits show no abnormalities. Bony prominences are unremarkable. Skin:  Porokeratosis sub3 right foot.. No infection or ulcers  Diagnosis:  Onychomycosis, , Pain in right toe, pain in left toes, Porokeratosis right foot.  Treatment & Plan Procedures and Treatment: Consent by patient was obtained for treatment procedures. The patient understood the discussion of treatment and procedures well. All questions were answered thoroughly reviewed. Debridement of mycotic and hypertrophic toenails, 1 through 5 bilateral and clearing of subungual debris. No ulceration, no infection .  Debride porokeratosis right foot. Return Visit-Office Procedure: Patient instructed to return to the office for a follow up visit 3 months for continued evaluation and treatment.   Douglas Adkins DPM

## 2015-10-04 ENCOUNTER — Other Ambulatory Visit: Payer: Self-pay | Admitting: Podiatry

## 2015-10-06 NOTE — Addendum Note (Signed)
Addended by: Marylou MccoyQUINTANA, Freddrick Gladson L on: 10/06/2015 01:46 PM   Modules accepted: Orders, Medications

## 2015-12-20 ENCOUNTER — Ambulatory Visit: Payer: Medicare Other | Admitting: Podiatry

## 2016-01-10 ENCOUNTER — Encounter: Payer: Self-pay | Admitting: Podiatry

## 2016-01-10 ENCOUNTER — Ambulatory Visit (INDEPENDENT_AMBULATORY_CARE_PROVIDER_SITE_OTHER): Payer: Medicare Other | Admitting: Podiatry

## 2016-01-10 VITALS — Ht 66.0 in | Wt 157.0 lb

## 2016-01-10 DIAGNOSIS — B351 Tinea unguium: Secondary | ICD-10-CM

## 2016-01-10 DIAGNOSIS — Q828 Other specified congenital malformations of skin: Secondary | ICD-10-CM

## 2016-01-10 DIAGNOSIS — M79676 Pain in unspecified toe(s): Secondary | ICD-10-CM

## 2016-01-10 NOTE — Progress Notes (Signed)
Patient ID: Douglas CoronaDouglas R Patnaude, male   DOB: December 10, 1955, 60 y.o.   MRN: [id number] Complaint:  Visit Type: Patient returns to my office for continued preventative foot care services. Complaint: Patient states" my nails have grown long and thick and become painful to walk and wear shoes" Patient has been diagnosed with DM with neuropathy.. The patient presents for preventative foot care services. No changes to ROS.  Painful callus right forefoot. Patient is pleased with his diabetic shoes. Podiatric Exam: Vascular: dorsalis pedis and posterior tibial pulses are palpable bilateral. Capillary return is immediate. Temperature gradient is WNL. Skin turgor WNL  Sensorium: Absent  Semmes Weinstein monofilament test. LOPS right WNL Nail Exam: Pt has thick disfigured discolored nails with subungual debris noted bilateral entire nail hallux through fifth toenails Ulcer Exam: There is no evidence of ulcer or pre-ulcerative changes or infection. Orthopedic Exam: Muscle tone and strength are WNL. No limitations in general ROM. No crepitus or effusions noted. Foot type and digits show no abnormalities. Bony prominences are unremarkable. Skin:  Porokeratosis sub3 right foot.. No infection or ulcers  Diagnosis:  Onychomycosis, , Pain in right toe, pain in left toes  Treatment & Plan Procedures and Treatment: Consent by patient was obtained for treatment procedures. The patient understood the discussion of treatment and procedures well. All questions were answered thoroughly reviewed. Debridement of mycotic and hypertrophic toenails, 1 through 5 bilateral and clearing of subungual debris. No ulceration, no infection .  Debride porokeratosis Return Visit-Office Procedure: Patient instructed to return to the office for a follow up visit 3 months for continued evaluation and treatment.   Helane GuntherGregory Lylla Eifler DPM

## 2016-04-10 ENCOUNTER — Ambulatory Visit: Payer: Medicare Other | Admitting: Podiatry

## 2016-04-28 ENCOUNTER — Ambulatory Visit: Payer: Medicare Other | Admitting: Sports Medicine

## 2016-05-04 DEATH — deceased
